# Patient Record
Sex: Male | Born: 1965 | Race: Black or African American | Hispanic: No | Marital: Married | Smoking: Former smoker
Health system: Southern US, Community
[De-identification: ages and names within clinical notes are randomized; demographics above are authoritative.]

## PROBLEM LIST (undated history)

## (undated) DIAGNOSIS — I499 Cardiac arrhythmia, unspecified: Secondary | ICD-10-CM

## (undated) DIAGNOSIS — Z87442 Personal history of urinary calculi: Secondary | ICD-10-CM

## (undated) DIAGNOSIS — I10 Essential (primary) hypertension: Secondary | ICD-10-CM

## (undated) DIAGNOSIS — Z9189 Other specified personal risk factors, not elsewhere classified: Secondary | ICD-10-CM

## (undated) DIAGNOSIS — N200 Calculus of kidney: Secondary | ICD-10-CM

## (undated) DIAGNOSIS — H409 Unspecified glaucoma: Secondary | ICD-10-CM

## (undated) DIAGNOSIS — R3915 Urgency of urination: Secondary | ICD-10-CM

## (undated) DIAGNOSIS — Z973 Presence of spectacles and contact lenses: Secondary | ICD-10-CM

## (undated) HISTORY — PX: EXTRACORPOREAL SHOCK WAVE LITHOTRIPSY: SHX1557

---

## 1998-04-12 ENCOUNTER — Encounter: Payer: Self-pay | Admitting: Urology

## 1998-04-12 ENCOUNTER — Ambulatory Visit (HOSPITAL_COMMUNITY): Admission: RE | Admit: 1998-04-12 | Discharge: 1998-04-12 | Payer: Self-pay | Admitting: Urology

## 1998-04-17 ENCOUNTER — Ambulatory Visit (HOSPITAL_COMMUNITY): Admission: RE | Admit: 1998-04-17 | Discharge: 1998-04-17 | Payer: Self-pay | Admitting: Urology

## 1998-04-17 ENCOUNTER — Encounter: Payer: Self-pay | Admitting: Urology

## 1998-05-10 ENCOUNTER — Encounter: Payer: Self-pay | Admitting: Urology

## 1998-05-10 ENCOUNTER — Ambulatory Visit (HOSPITAL_COMMUNITY): Admission: RE | Admit: 1998-05-10 | Discharge: 1998-05-10 | Payer: Self-pay | Admitting: Urology

## 1998-08-15 ENCOUNTER — Encounter: Payer: Self-pay | Admitting: Urology

## 1998-08-15 ENCOUNTER — Ambulatory Visit (HOSPITAL_COMMUNITY): Admission: RE | Admit: 1998-08-15 | Discharge: 1998-08-15 | Payer: Self-pay | Admitting: Urology

## 2000-05-06 HISTORY — PX: ENDOPYELOTOMY: SHX1501

## 2003-01-31 ENCOUNTER — Ambulatory Visit (HOSPITAL_COMMUNITY): Admission: RE | Admit: 2003-01-31 | Discharge: 2003-01-31 | Payer: Self-pay | Admitting: Urology

## 2003-01-31 ENCOUNTER — Encounter: Payer: Self-pay | Admitting: Urology

## 2004-04-09 ENCOUNTER — Ambulatory Visit (HOSPITAL_COMMUNITY): Admission: RE | Admit: 2004-04-09 | Discharge: 2004-04-09 | Payer: Self-pay | Admitting: Urology

## 2004-10-29 ENCOUNTER — Ambulatory Visit (HOSPITAL_COMMUNITY): Admission: RE | Admit: 2004-10-29 | Discharge: 2004-10-29 | Payer: Self-pay | Admitting: Urology

## 2007-11-13 ENCOUNTER — Ambulatory Visit (HOSPITAL_COMMUNITY): Admission: RE | Admit: 2007-11-13 | Discharge: 2007-11-13 | Payer: Self-pay | Admitting: Urology

## 2009-07-10 ENCOUNTER — Ambulatory Visit (HOSPITAL_COMMUNITY): Admission: RE | Admit: 2009-07-10 | Discharge: 2009-07-10 | Payer: Self-pay | Admitting: Urology

## 2011-12-08 ENCOUNTER — Other Ambulatory Visit: Payer: Self-pay | Admitting: Oncology

## 2014-07-04 ENCOUNTER — Other Ambulatory Visit (HOSPITAL_COMMUNITY): Payer: Self-pay | Admitting: Urology

## 2014-07-04 DIAGNOSIS — Q6211 Congenital occlusion of ureteropelvic junction: Secondary | ICD-10-CM

## 2014-07-04 DIAGNOSIS — N1330 Unspecified hydronephrosis: Secondary | ICD-10-CM

## 2014-07-04 DIAGNOSIS — Q6239 Other obstructive defects of renal pelvis and ureter: Secondary | ICD-10-CM

## 2014-07-18 ENCOUNTER — Ambulatory Visit (HOSPITAL_COMMUNITY): Payer: Managed Care, Other (non HMO) | Attending: Urology

## 2014-09-29 ENCOUNTER — Ambulatory Visit (HOSPITAL_COMMUNITY)
Admission: RE | Admit: 2014-09-29 | Discharge: 2014-09-29 | Disposition: A | Discharge status: Home / Self Care | Payer: 59 | Source: Ambulatory Visit | Attending: Urology | Admitting: Urology

## 2014-09-29 DIAGNOSIS — N1330 Unspecified hydronephrosis: Secondary | ICD-10-CM | POA: Diagnosis not present

## 2014-09-29 DIAGNOSIS — Q6239 Other obstructive defects of renal pelvis and ureter: Secondary | ICD-10-CM

## 2014-09-29 DIAGNOSIS — Q6211 Congenital occlusion of ureteropelvic junction: Secondary | ICD-10-CM

## 2014-09-29 MED ORDER — TECHNETIUM TC 99M MERTIATIDE
15.8000 | Freq: Once | INTRAVENOUS | Status: AC | PRN
  Administered 2014-09-29: 16 via INTRAVENOUS

## 2014-09-29 MED ORDER — FUROSEMIDE 10 MG/ML IJ SOLN
33.0000 mg | Freq: Once | INTRAMUSCULAR | Status: AC
  Administered 2014-09-29: 33 mg via INTRAVENOUS
  Filled 2014-09-29: qty 4

## 2014-10-27 ENCOUNTER — Emergency Department (HOSPITAL_COMMUNITY): Payer: 59

## 2014-10-27 ENCOUNTER — Emergency Department (HOSPITAL_COMMUNITY)
Admission: EM | Admit: 2014-10-27 | Discharge: 2014-10-27 | Disposition: A | Discharge status: Home / Self Care | Payer: 59 | Attending: Emergency Medicine | Admitting: Emergency Medicine

## 2014-10-27 ENCOUNTER — Encounter (HOSPITAL_COMMUNITY): Payer: Self-pay | Admitting: Emergency Medicine

## 2014-10-27 DIAGNOSIS — N201 Calculus of ureter: Secondary | ICD-10-CM | POA: Diagnosis not present

## 2014-10-27 DIAGNOSIS — R1110 Vomiting, unspecified: Secondary | ICD-10-CM | POA: Insufficient documentation

## 2014-10-27 DIAGNOSIS — R509 Fever, unspecified: Secondary | ICD-10-CM | POA: Diagnosis not present

## 2014-10-27 DIAGNOSIS — R1030 Lower abdominal pain, unspecified: Secondary | ICD-10-CM | POA: Diagnosis present

## 2014-10-27 LAB — CBC WITH DIFFERENTIAL/PLATELET
BASOS ABS: 0.1 10*3/uL (ref 0.0–0.1)
BASOS PCT: 1 % (ref 0–1)
EOS PCT: 5 % (ref 0–5)
Eosinophils Absolute: 0.4 10*3/uL (ref 0.0–0.7)
HCT: 41.6 % (ref 39.0–52.0)
Hemoglobin: 14.1 g/dL (ref 13.0–17.0)
LYMPHS PCT: 25 % (ref 12–46)
Lymphs Abs: 2 10*3/uL (ref 0.7–4.0)
MCH: 24.9 pg — ABNORMAL LOW (ref 26.0–34.0)
MCHC: 33.9 g/dL (ref 30.0–36.0)
MCV: 73.4 fL — AB (ref 78.0–100.0)
Monocytes Absolute: 0.7 10*3/uL (ref 0.1–1.0)
Monocytes Relative: 9 % (ref 3–12)
NEUTROS ABS: 4.6 10*3/uL (ref 1.7–7.7)
Neutrophils Relative %: 60 % (ref 43–77)
PLATELETS: 260 10*3/uL (ref 150–400)
RBC: 5.67 MIL/uL (ref 4.22–5.81)
RDW: 15.2 % (ref 11.5–15.5)
WBC: 7.8 10*3/uL (ref 4.0–10.5)

## 2014-10-27 LAB — COMPREHENSIVE METABOLIC PANEL
ALK PHOS: 61 U/L (ref 38–126)
ALT: 37 U/L (ref 17–63)
ANION GAP: 13 (ref 5–15)
AST: 28 U/L (ref 15–41)
Albumin: 4.6 g/dL (ref 3.5–5.0)
BILIRUBIN TOTAL: 0.6 mg/dL (ref 0.3–1.2)
BUN: 20 mg/dL (ref 6–20)
CHLORIDE: 99 mmol/L — AB (ref 101–111)
CO2: 25 mmol/L (ref 22–32)
CREATININE: 1.85 mg/dL — AB (ref 0.61–1.24)
Calcium: 9.3 mg/dL (ref 8.9–10.3)
GFR calc non Af Amer: 41 mL/min — ABNORMAL LOW (ref >60)
GFR, EST AFRICAN AMERICAN: 48 mL/min — AB (ref >60)
GLUCOSE: 95 mg/dL (ref 65–99)
POTASSIUM: 3.2 mmol/L — AB (ref 3.5–5.1)
Sodium: 137 mmol/L (ref 135–145)
TOTAL PROTEIN: 8.7 g/dL — AB (ref 6.5–8.1)

## 2014-10-27 LAB — URINALYSIS, ROUTINE W REFLEX MICROSCOPIC
Bilirubin Urine: NEGATIVE
GLUCOSE, UA: NEGATIVE mg/dL
HGB URINE DIPSTICK: NEGATIVE
Ketones, ur: NEGATIVE mg/dL
Leukocytes, UA: NEGATIVE
Nitrite: NEGATIVE
Protein, ur: NEGATIVE mg/dL
SPECIFIC GRAVITY, URINE: 1.019 (ref 1.005–1.030)
Urobilinogen, UA: 0.2 mg/dL (ref 0.0–1.0)
pH: 5.5 (ref 5.0–8.0)

## 2014-10-27 LAB — LIPASE, BLOOD: Lipase: 33 U/L (ref 22–51)

## 2014-10-27 MED ORDER — SODIUM CHLORIDE 0.9 % IV BOLUS (SEPSIS)
1000.0000 mL | Freq: Once | INTRAVENOUS | Status: AC
  Administered 2014-10-27: 1000 mL via INTRAVENOUS

## 2014-10-27 MED ORDER — IOHEXOL 300 MG/ML  SOLN
100.0000 mL | Freq: Once | INTRAMUSCULAR | Status: AC | PRN
  Administered 2014-10-27: 100 mL via INTRAVENOUS

## 2014-10-27 MED ORDER — HYDROCODONE-ACETAMINOPHEN 5-325 MG PO TABS: 1.0000 | ORAL_TABLET | ORAL | Status: DC | PRN

## 2014-10-27 MED ORDER — KETOROLAC TROMETHAMINE 30 MG/ML IJ SOLN
30.0000 mg | Freq: Once | INTRAMUSCULAR | Status: AC
  Administered 2014-10-27: 30 mg via INTRAVENOUS
  Filled 2014-10-27: qty 1

## 2014-10-27 NOTE — ED Provider Notes (Signed)
CSN: [id number]     Arrival date & time 10/27/14  1639 History   First MD Initiated Contact with Patient 10/27/14 1926     Chief Complaint  Patient presents with  . Abdominal Pain     (Consider location/radiation/quality/duration/timing/severity/associated sxs/prior Treatment) HPI  49 year old male presents with lower abdominal pain that has been progressively worsening over the past 3 days. Pain comes and goes in waves but there is always some level of pain. When he pushes on his lower abdomen it seems to radiate around to his right flank. No hematuria or dysuria but he does feel like he is urinating less. Has felt low-grade temperatures up to 99. Vomited when the pain started but has not had any nausea or vomiting since. No diarrhea or constipation. No prior abdominal issues at all. Rates his pain is moderate. Last took ibuprofen around 2 PM which relieves his pain but only last for about an hour.  History reviewed. No pertinent past medical history. No past surgical history on file. No family history on file. History  Substance Use Topics  . Smoking status: Never Smoker   . Smokeless tobacco: Not on file  . Alcohol Use: No    Review of Systems  Constitutional: Positive for fever.  Gastrointestinal: Positive for vomiting and abdominal pain. Negative for nausea and diarrhea.  Genitourinary: Positive for decreased urine volume. Negative for dysuria.  Musculoskeletal: Positive for back pain.  All other systems reviewed and are negative.     Allergies  Review of patient's allergies indicates no known allergies.  Home Medications   Prior to Admission medications   Not on File   BP 135/89 mmHg  Pulse 69  Temp(Src) 97.9 F (36.6 C) (Oral)  Resp 18  SpO2 99% Physical Exam  Constitutional: He is oriented to person, place, and time. He appears well-developed and well-nourished.  HENT:  Head: Normocephalic and atraumatic.  Right Ear: External ear normal.  Left Ear: External  ear normal.  Nose: Nose normal.  Eyes: Right eye exhibits no discharge. Left eye exhibits no discharge.  Neck: Neck supple.  Cardiovascular: Normal rate, regular rhythm, normal heart sounds and intact distal pulses.   Pulmonary/Chest: Effort normal.  Abdominal: Soft. There is tenderness in the suprapubic area. There is no CVA tenderness.  Musculoskeletal: He exhibits no edema.  Neurological: He is alert and oriented to person, place, and time.  Skin: Skin is warm and dry.  Nursing note and vitals reviewed.   ED Course  Procedures (including critical care time) Labs Review Labs Reviewed  COMPREHENSIVE METABOLIC PANEL - Abnormal; Notable for the following:    Potassium 3.2 (*)    Chloride 99 (*)    Creatinine, Ser 1.85 (*)    Total Protein 8.7 (*)    GFR calc non Af Amer 41 (*)    GFR calc Af Amer 48 (*)    All other components within normal limits  CBC WITH DIFFERENTIAL/PLATELET - Abnormal; Notable for the following:    MCV 73.4 (*)    MCH 24.9 (*)    All other components within normal limits  URINALYSIS, ROUTINE W REFLEX MICROSCOPIC (NOT AT [facility]) - Abnormal; Notable for the following:    APPearance CLOUDY (*)    All other components within normal limits  LIPASE, BLOOD    Imaging Review Ct Abdomen Pelvis W Contrast  10/27/2014   CLINICAL DATA:  Acute onset right lower quadrant abdominal pain for 2 days. Low-grade fever. Initial encounter.  EXAM: CT ABDOMEN AND  PELVIS WITH CONTRAST  TECHNIQUE: Multidetector CT imaging of the abdomen and pelvis was performed using the standard protocol following bolus administration of intravenous contrast.  CONTRAST:  OMNIPAQUE IOHEXOL 300 MG/ML  SOLN  COMPARISON:  Abdominal radiograph performed 06/28/2014  FINDINGS: Mild patchy left basilar opacity may reflect atelectasis or mild pneumonia.  The liver and spleen are unremarkable in appearance. The gallbladder is within normal limits. The pancreas and adrenal glands are unremarkable.  There  is mild right-sided hydronephrosis. Delayed images demonstrate no evidence of contrast excretion on the right, concerning for renal insufficiency secondary to obstruction. An obstructing 5 x 4 mm stone is noted proximally at the right ureteropelvic junction. Two nonobstructing stones are noted at the lower pole of the right kidney, measuring up to 5 mm in size.  There is severe chronic left-sided hydronephrosis, with mild underlying renal atrophy. There is diffuse distention of the left ureter, without evidence of a distal obstructing stone. This may reflect a distal stricture at the left vesicoureteral junction.  No free fluid is identified. The small bowel is unremarkable in appearance. The stomach is within normal limits. No acute vascular abnormalities are seen.  The appendix is normal in caliber and contains trace air, without evidence of appendicitis. The colon is unremarkable in appearance.  The bladder is mildly distended and grossly unremarkable in appearance. A small urachal remnant is incidentally seen. The prostate remains normal in size, with central calcification. No inguinal lymphadenopathy is seen.  No acute osseous abnormalities are identified.  IMPRESSION: 1. Mild right-sided hydronephrosis. Obstructing 5 x 4 mm stone noted proximally at the right ureteropelvic junction. Delayed images demonstrate no evidence of contrast excretion on the right, concerning for renal insufficiency secondary to obstruction. 2. Severe chronic left-sided hydronephrosis, with mild underlying left renal atrophy. Diffuse distention of the left ureter, without evidence of a distal obstructing stone on the left. This may reflect a distal stricture at the left vesicoureteral junction. Given effective bilateral obstruction, there is significant concern for impending renal failure. Urology consult is recommended. 3. Two nonobstructing right renal stones noted, measuring up to 5 mm in size. 4. Mild patchy left basilar airspace  opacity may reflect atelectasis or mild pneumonia.  These results were called by telephone at the time of interpretation on 10/27/2014 at 9:35 pm to Dr. Pricilla Loveless , who verbally acknowledged these results.   Electronically Signed   By: Roanna Raider M.D.   On: 10/27/2014 21:37     EKG Interpretation None      MDM   Final diagnoses:  Obstruction of right ureteropelvic junction (UPJ) due to stone    Patient's symptoms appear to be from right UPJ stone. Given his chronic left kidney problems he has concerning hydronephrosis. D/w Dr. Ronne Binning, who reviewed urology notes and notes he has a typical Cr of 1.6. Was given IV fluids in ED, and cautioned on no more ibuprofen use for now. Pain is controlled. Dr. Ronne Binning recommends discharge with follow up in his office in AM for exam and possible removal in afternoon.     Pricilla Loveless, MD 10/28/14 [phone]

## 2014-10-27 NOTE — ED Notes (Signed)
Pt c/o RLQ since Monday.  Pt reports one episode of vomiting since then.  Sent by PCP to r/o appy.

## 2014-10-27 NOTE — Discharge Instructions (Signed)
Do not take ibuprofen, Aleve, Motrin, or other NSAIDs due to your kidney function. Take tylenol and/or the Hydrocodone. Follow-up in Dr. Dimas Millin office tomorrow morning, you may need to have the kidney stone removed tomorrow.

## 2014-10-31 ENCOUNTER — Other Ambulatory Visit: Payer: Self-pay | Admitting: Urology

## 2014-10-31 ENCOUNTER — Other Ambulatory Visit (HOSPITAL_COMMUNITY): Payer: Self-pay | Admitting: Anesthesiology

## 2014-10-31 NOTE — Progress Notes (Signed)
10/27/2014-noted in EPIC- labs-CBC w/diff., CMP, Lipase U/A and Xray CT Abd./pelvis w/contrast

## 2014-10-31 NOTE — Patient Instructions (Addendum)
[name]  10/31/2014   Your procedure is scheduled on: Wednesday 11/02/2014  Report to [facility]  Entrance take [facility]  elevators to 3rd floor to  Short Stay Center at  0630 AM.  Call this number if you have problems the morning of surgery [phone]   Remember: ONLY 1 PERSON MAY GO WITH YOU TO SHORT STAY TO GET  READY MORNING OF YOUR SURGERY.  Do not eat food or drink liquids :After Midnight.     Take these medicines the morning of surgery with A SIP OF WATER: Amlodipine                               You may not have any metal on your body including hair pins and              piercings  Do not wear jewelry, make-up, lotions, powders or perfumes, deodorant             Do not wear nail polish.  Do not shave  48 hours prior to surgery.              Men may shave face and neck.   Do not bring valuables to the hospital. [facility] IS NOT             RESPONSIBLE   FOR VALUABLES.  Contacts, dentures or bridgework may not be worn into surgery.  Leave suitcase in the car. After surgery it may be brought to your room.     Patients discharged the day of surgery will not be allowed to drive home.  Name and phone number of your driver:Wife Lennart Fergen cell [phone]          _____________________________________________________________________             [facility] - Preparing for Surgery Before surgery, you can play an important role.  Because skin is not sterile, your skin needs to be as free of germs as possible.  You can reduce the number of germs on your skin by washing with CHG (chlorahexidine gluconate) soap before surgery.  CHG is an antiseptic cleaner which kills germs and bonds with the skin to continue killing germs even after washing. Please DO NOT use if you have an allergy to CHG or antibacterial soaps.  If your skin becomes reddened/irritated stop using the CHG and inform your nurse when you arrive at Short Stay. Do not shave  (including legs and underarms) for at least 48 hours prior to the first CHG shower.  You may shave your face/neck. Please follow these instructions carefully:  1.  Shower with CHG Soap the night before surgery and the  morning of Surgery.  2.  If you choose to wash your hair, wash your hair first as usual with your  normal  shampoo.  3.  After you shampoo, rinse your hair and body thoroughly to remove the  shampoo.                           4.  Use CHG as you would any other liquid soap.  You can apply chg directly  to the skin and wash                       Gently with a scrungie  or clean washcloth.  5.  Apply the CHG Soap to your body ONLY FROM THE NECK DOWN.   Do not use on face/ open                           Wound or open sores. Avoid contact with eyes, ears mouth and genitals (private parts).                       Wash face,  Genitals (private parts) with your normal soap.             6.  Wash thoroughly, paying special attention to the area where your surgery  will be performed.  7.  Thoroughly rinse your body with warm water from the neck down.  8.  DO NOT shower/wash with your normal soap after using and rinsing off  the CHG Soap.                9.  Pat yourself dry with a clean towel.            10.  Wear clean pajamas.            11.  Place clean sheets on your bed the night of your first shower and do not  sleep with pets. Day of Surgery : Do not apply any lotions/deodorants the morning of surgery.  Please wear clean clothes to the hospital/surgery center.  FAILURE TO FOLLOW THESE INSTRUCTIONS MAY RESULT IN THE CANCELLATION OF YOUR SURGERY PATIENT SIGNATURE_________________________________  NURSE SIGNATURE__________________________________  ________________________________________________________________________

## 2014-11-01 ENCOUNTER — Encounter (HOSPITAL_COMMUNITY)
Admission: RE | Admit: 2014-11-01 | Discharge: 2014-11-01 | Disposition: A | Discharge status: Home / Self Care | Payer: 59 | Source: Ambulatory Visit | Attending: Urology | Admitting: Urology

## 2014-11-01 ENCOUNTER — Encounter (HOSPITAL_COMMUNITY): Payer: Self-pay

## 2014-11-01 DIAGNOSIS — N132 Hydronephrosis with renal and ureteral calculous obstruction: Secondary | ICD-10-CM | POA: Diagnosis not present

## 2014-11-01 DIAGNOSIS — N289 Disorder of kidney and ureter, unspecified: Secondary | ICD-10-CM | POA: Diagnosis not present

## 2014-11-01 DIAGNOSIS — N135 Crossing vessel and stricture of ureter without hydronephrosis: Secondary | ICD-10-CM | POA: Diagnosis present

## 2014-11-01 DIAGNOSIS — Z79899 Other long term (current) drug therapy: Secondary | ICD-10-CM | POA: Diagnosis not present

## 2014-11-01 DIAGNOSIS — I4589 Other specified conduction disorders: Secondary | ICD-10-CM | POA: Diagnosis not present

## 2014-11-01 DIAGNOSIS — I10 Essential (primary) hypertension: Secondary | ICD-10-CM | POA: Diagnosis not present

## 2014-11-01 HISTORY — DX: Personal history of urinary calculi: Z87.442

## 2014-11-01 HISTORY — DX: Essential (primary) hypertension: I10

## 2014-11-02 ENCOUNTER — Encounter (HOSPITAL_COMMUNITY): Payer: Self-pay | Admitting: *Deleted

## 2014-11-02 ENCOUNTER — Ambulatory Visit (HOSPITAL_COMMUNITY)
Admission: RE | Admit: 2014-11-02 | Discharge: 2014-11-02 | Disposition: A | Discharge status: Home / Self Care | Payer: 59 | Source: Ambulatory Visit | Attending: Urology | Admitting: Urology

## 2014-11-02 ENCOUNTER — Ambulatory Visit (HOSPITAL_COMMUNITY): Payer: 59 | Admitting: Certified Registered Nurse Anesthetist

## 2014-11-02 ENCOUNTER — Encounter (HOSPITAL_COMMUNITY)
Admission: RE | Disposition: A | Discharge status: Home / Self Care | Payer: Self-pay | Source: Ambulatory Visit | Attending: Urology

## 2014-11-02 DIAGNOSIS — N289 Disorder of kidney and ureter, unspecified: Secondary | ICD-10-CM | POA: Insufficient documentation

## 2014-11-02 DIAGNOSIS — N135 Crossing vessel and stricture of ureter without hydronephrosis: Secondary | ICD-10-CM | POA: Insufficient documentation

## 2014-11-02 DIAGNOSIS — I4589 Other specified conduction disorders: Secondary | ICD-10-CM | POA: Insufficient documentation

## 2014-11-02 DIAGNOSIS — Z79899 Other long term (current) drug therapy: Secondary | ICD-10-CM | POA: Insufficient documentation

## 2014-11-02 DIAGNOSIS — N132 Hydronephrosis with renal and ureteral calculous obstruction: Secondary | ICD-10-CM | POA: Insufficient documentation

## 2014-11-02 DIAGNOSIS — I10 Essential (primary) hypertension: Secondary | ICD-10-CM | POA: Insufficient documentation

## 2014-11-02 HISTORY — PX: CYSTOSCOPY WITH RETROGRADE PYELOGRAM, URETEROSCOPY AND STENT PLACEMENT: SHX5789

## 2014-11-02 SURGERY — CYSTOURETEROSCOPY, WITH RETROGRADE PYELOGRAM AND STENT INSERTION
Anesthesia: General | Site: Ureter | Laterality: Right

## 2014-11-02 MED ORDER — LIDOCAINE HCL (CARDIAC) 20 MG/ML IV SOLN
INTRAVENOUS | Status: AC
Start: 2014-11-02 — End: 2014-11-02
  Filled 2014-11-02: qty 5

## 2014-11-02 MED ORDER — FENTANYL CITRATE (PF) 100 MCG/2ML IJ SOLN
INTRAMUSCULAR | Status: AC
  Filled 2014-11-02: qty 2

## 2014-11-02 MED ORDER — PROPOFOL 10 MG/ML IV BOLUS
INTRAVENOUS | Status: AC
  Filled 2014-11-02: qty 20

## 2014-11-02 MED ORDER — SODIUM CHLORIDE 0.9 % IR SOLN
Status: DC | PRN
  Administered 2014-11-02: 3000 mL
  Administered 2014-11-02: 1000 mL

## 2014-11-02 MED ORDER — PROPOFOL 10 MG/ML IV BOLUS
INTRAVENOUS | Status: DC | PRN
  Administered 2014-11-02: 50 mg via INTRAVENOUS
  Administered 2014-11-02: 150 mg via INTRAVENOUS

## 2014-11-02 MED ORDER — ONDANSETRON HCL 4 MG/2ML IJ SOLN
INTRAMUSCULAR | Status: DC | PRN
  Administered 2014-11-02: 4 mg via INTRAVENOUS

## 2014-11-02 MED ORDER — MIDAZOLAM HCL 5 MG/5ML IJ SOLN
INTRAMUSCULAR | Status: DC | PRN
  Administered 2014-11-02: 2 mg via INTRAVENOUS

## 2014-11-02 MED ORDER — IOHEXOL 300 MG/ML  SOLN
INTRAMUSCULAR | Status: DC | PRN
  Administered 2014-11-02: 7 mL via URETHRAL

## 2014-11-02 MED ORDER — MEPERIDINE HCL 50 MG/ML IJ SOLN: 6.2500 mg | INTRAMUSCULAR | Status: DC | PRN

## 2014-11-02 MED ORDER — ONDANSETRON HCL 4 MG/2ML IJ SOLN: 4.0000 mg | Freq: Once | INTRAMUSCULAR | Status: DC | PRN

## 2014-11-02 MED ORDER — ONDANSETRON HCL 4 MG/2ML IJ SOLN
INTRAMUSCULAR | Status: AC
  Filled 2014-11-02: qty 2

## 2014-11-02 MED ORDER — TAMSULOSIN HCL 0.4 MG PO CAPS: 0.4000 mg | ORAL_CAPSULE | Freq: Every day | ORAL | Status: DC

## 2014-11-02 MED ORDER — HYDROMORPHONE HCL 1 MG/ML IJ SOLN: 0.2500 mg | INTRAMUSCULAR | Status: DC | PRN

## 2014-11-02 MED ORDER — FENTANYL CITRATE (PF) 100 MCG/2ML IJ SOLN
INTRAMUSCULAR | Status: DC | PRN
  Administered 2014-11-02 (×2): 50 ug via INTRAVENOUS
  Administered 2014-11-02 (×4): 25 ug via INTRAVENOUS

## 2014-11-02 MED ORDER — BELLADONNA ALKALOIDS-OPIUM 16.2-60 MG RE SUPP
RECTAL | Status: AC
  Filled 2014-11-02: qty 1

## 2014-11-02 MED ORDER — LIDOCAINE HCL (CARDIAC) 20 MG/ML IV SOLN
INTRAVENOUS | Status: DC | PRN
  Administered 2014-11-02: 100 mg via INTRAVENOUS

## 2014-11-02 MED ORDER — CEFTRIAXONE SODIUM IN DEXTROSE 20 MG/ML IV SOLN
1.0000 g | Freq: Once | INTRAVENOUS | Status: AC
  Administered 2014-11-02: 1 g via INTRAVENOUS
  Filled 2014-11-02: qty 50

## 2014-11-02 MED ORDER — LIDOCAINE HCL 2 % EX GEL
CUTANEOUS | Status: AC
  Filled 2014-11-02: qty 10

## 2014-11-02 MED ORDER — OXYBUTYNIN CHLORIDE 5 MG PO TABS: 5.0000 mg | ORAL_TABLET | Freq: Three times a day (TID) | ORAL | Status: DC | PRN

## 2014-11-02 MED ORDER — HYDROCODONE-ACETAMINOPHEN 5-325 MG PO TABS
1.0000 | ORAL_TABLET | ORAL | Status: DC | PRN
  Administered 2014-11-02: 1 via ORAL
  Filled 2014-11-02: qty 1

## 2014-11-02 MED ORDER — LACTATED RINGERS IV SOLN
INTRAVENOUS | Status: DC | PRN
  Administered 2014-11-02: 08:00:00 via INTRAVENOUS

## 2014-11-02 MED ORDER — HYDROCODONE-ACETAMINOPHEN 5-325 MG PO TABS: 1.0000 | ORAL_TABLET | ORAL | Status: DC | PRN

## 2014-11-02 MED ORDER — MIDAZOLAM HCL 2 MG/2ML IJ SOLN
INTRAMUSCULAR | Status: AC
  Filled 2014-11-02: qty 2

## 2014-11-02 SURGICAL SUPPLY — 12 items
CATH INTERMIT  6FR 70CM (CATHETERS) ×3 IMPLANT
CLOTH BEACON ORANGE TIMEOUT ST (SAFETY) ×3 IMPLANT
GLOVE BIOGEL M STRL SZ7.5 (GLOVE) ×3 IMPLANT
GOWN STRL REUS W/TWL XL LVL3 (GOWN DISPOSABLE) ×6 IMPLANT
GUIDEWIRE ANG ZIPWIRE 038X150 (WIRE) ×3 IMPLANT
GUIDEWIRE STR DUAL SENSOR (WIRE) ×3 IMPLANT
IV NS IRRIG 3000ML ARTHROMATIC (IV SOLUTION) ×3 IMPLANT
PACK CYSTO (CUSTOM PROCEDURE TRAY) ×3 IMPLANT
SHEATH ACCESS URETERAL 38CM (SHEATH) ×3 IMPLANT
STENT URET 6FRX24 CONTOUR (STENTS) ×3 IMPLANT
SYRINGE 10CC LL (SYRINGE) ×3 IMPLANT
TUBE FEEDING 8FR 16IN STR KANG (MISCELLANEOUS) ×3 IMPLANT

## 2014-11-02 NOTE — Anesthesia Preprocedure Evaluation (Signed)
Anesthesia Evaluation  Patient identified by MRN, date of birth, ID band Patient awake    Reviewed: Allergy & Precautions, NPO status , Patient's Chart, lab work & pertinent test results  Airway Mallampati: I  TM Distance: >3 FB Neck ROM: Full    Dental   Pulmonary    Pulmonary exam normal        Cardiovascular hypertension, Pt. on medications Normal cardiovascular exam     Neuro/Psych    GI/Hepatic   Endo/Other    Renal/GU      Musculoskeletal   Abdominal   Peds  Hematology   Anesthesia Other Findings   Reproductive/Obstetrics                             Anesthesia Physical Anesthesia Plan  ASA: II  Anesthesia Plan: General   Post-op Pain Management:    Induction: Intravenous  Airway Management Planned: LMA  Additional Equipment:   Intra-op Plan:   Post-operative Plan: Extubation in OR  Informed Consent: I have reviewed the patients History and Physical, chart, labs and discussed the procedure including the risks, benefits and alternatives for the proposed anesthesia with the patient or authorized representative who has indicated his/her understanding and acceptance.     Plan Discussed with: CRNA and Surgeon  Anesthesia Plan Comments:         Anesthesia Quick Evaluation  

## 2014-11-02 NOTE — Anesthesia Postprocedure Evaluation (Signed)
Anesthesia Post Note  Patient: [name]  Procedure(s) Performed: Procedure(s) (LRB): CYSTOSCOPY WITH RIGHT  RETROGRADE PYELOGRAM, diagnostic URETEROSCOPY AND STENT PLACEMENT (Right)  Anesthesia type: general  Patient location: PACU  Post pain: Pain level controlled  Post assessment: Patient's Cardiovascular Status Stable  Last Vitals:  Filed Vitals:   11/02/14 0957  BP: 134/95  Pulse: 69  Temp: 36.5 C  Resp: 16    Post vital signs: Reviewed and stable  Level of consciousness: sedated  Complications: No apparent anesthesia complications

## 2014-11-02 NOTE — H&P (Signed)
Terrance BackKenneth B Morales is an 49 y.o. male.    Chief Complaint: Pre-OP Right Ureteroscopic Stone Manipulation  HPI:    1 - Recurrent Nephrolithiasis -  2011 - SWL x2 10/2014 - Rt 4mm prox ureteral with hydro (SSD 11cm, 500HU) and 5mm ipsilateral lower pole by CT in ER 10/27/14. Not seen on scout images due to bowel gas.   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  2016 - renogram with symmeteric function and only slihgt left delay, T1/2 17 min  3 - Renal Insufficiency - cr 1.8 noted on ER labs during eval for right ureteral stone. Non-diabetic.   PMH unremarkable. NO CV disease. 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 No interval fevers or stone passage. Most recent UCX negative.    Past Medical History  Diagnosis Date  . Hypertension   . History of kidney stones     Past Surgical History  Procedure Laterality Date  . Lithotripsy  last done 5 years ago    x 2  . Procedure on left kidney  2002    No family history on file. Social History:  reports that he has never smoked. He has never used smokeless tobacco. He reports that he drinks alcohol. He reports that he does not use illicit drugs.  Allergies: No Known Allergies  No prescriptions prior to admission    No results found for this or any previous visit (from the past 48 hour(s)). No results found.  Review of Systems  Constitutional: Negative.  Negative for fever and chills.  HENT: Negative.   Eyes: Negative.   Respiratory: Negative.   Cardiovascular: Negative.   Gastrointestinal: Negative.  Negative for nausea and vomiting.  Genitourinary: Positive for flank pain.  Musculoskeletal: Negative.   Skin: Negative.   Neurological: Negative.   Endo/Heme/Allergies: Negative.   Psychiatric/Behavioral: Negative.     There were no vitals taken for this visit. Physical Exam  Constitutional: He appears well-developed.  HENT:  Head: Normocephalic.   Eyes: Pupils are equal, round, and reactive to light.  Neck: Normal range of motion.  Cardiovascular: Normal rate.   Respiratory: Effort normal.  GI: Soft.  Genitourinary:  Mild Rt CVAT  Musculoskeletal: Normal range of motion.  Neurological: He is alert.  Skin: Skin is warm.  Psychiatric: He has a normal mood and affect. His behavior is normal. Judgment and thought content normal.     Assessment/Plan   1 - Recurrent Nephrolithiasis -   We rediscussed ureteroscopic stone manipulation with basketing and laser-lithotripsy in detail.  We rediscussed risks including bleeding, infection, damage to kidney / ureter  bladder, rarely loss of kidney. We rediscussed anesthetic risks and rare but serious surgical complications including DVT, PE, MI, and mortality. We specifically readdressed that in 5-10% of cases a staged approach is required with stenting followed by re-attempt ureteroscopy if anatomy unfavorable.   The patient voiced understanding and wishes to proceed today as planned.   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  Avan Gullett 11/02/2014, 6:33 AM

## 2014-11-02 NOTE — Discharge Instructions (Signed)
1 - You may have urinary urgency (bladder spasms) and bloody urine on / off with stent in place. This is normal. ° °2 - Call MD or go to ER for fever >102, severe pain / nausea / vomiting not relieved by medications, or acute change in medical status ° °

## 2014-11-02 NOTE — Progress Notes (Signed)
Note pt has LR infused in short stay (post op)

## 2014-11-02 NOTE — Op Note (Signed)
NAMEMarland Kitchen  BESNIK, FEBUS NO.:  [id number]  MEDICAL RECORD NO.:  [id number]  LOCATION:  [facility]                         FACILITY:  [facility]  PHYSICIAN:  Sebastian Ache, MD     DATE OF BIRTH:  Oct 09, 1965  DATE OF PROCEDURE:  11/02/2014                              OPERATIVE REPORT  DIAGNOSES: 1. Chronic mild left ureterovesical junction obstruction. 2. Right ureteral and renal stones with renal colic.  PROCEDURES: 1. Cystoscopy with right retrograde pyelogram with interpretation. 2. Right diagnostic ureteroscopy. 3. Insertion of right ureteral stent 6 x 24 contour, no tether.  SURGEON:  Sebastian Ache, MD.  ESTIMATED BLOOD LOSS:  Nil.  COMPLICATIONS:  None.  SPECIMEN:  None.  FINDINGS: 1. Very petite body habitus. 2. Significant relative narrowing and tortuosity of proximal right ureter     without focal stricture.  This did not allow safe navigation even     with a single channel ureteroscope to the level of the kidney. 3. Retrograde positioning of proximal ureteral stones to the area of     lower pole. 4. Successful placement of right ureteral stent, proximal in upper     pole, distal in urinary bladder.  INDICATION:  Mr. Kantner is a pleasant 49 year old gentleman with history of nephrolithiasis and chronic left-sided obstruction, status post endoscopic management of his left side, and he has had stable renal function with only mild delaying on the left.  He unfortunately developed acute right renal colic from multifocal right-sided stent, including a proximal ureteral stone and presented to the office late last week.  Options were discussed for management including shockwave lithotripsy versus medical therapy versus ureteroscopy with goal of stone freeing, and he wished to proceed with the latter.  Informed consent was obtained and placed in medical record.  PROCEDURE IN DETAIL:  The patient being Tullio Chausse and procedure being right  ureteroscopic stone manipulation was confirmed.  Procedure was carried out.  Time-out was performed.  Intravenous antibiotics were administered.  General LMA anesthesia was induced.  The patient was placed into a low lithotomy position.  Sterile field was created by prepping the patient's penis, perineum, proximal thighs using iodine x3. Next, cystourethroscopy was performed using a 23-French rigid cystoscope with 30-degree lens.  First, his anterior and posterior urethra were unremarkable.  Inspection of the urinary bladder revealed no diverticula, calcifications, papular lesions.  Both ureteral orifices were singleton and visibly patent.  The right ureteral orifice was cannulated with a 6-French end-hole catheter, and right retrograde pyelogram was obtained.  Right retrograde pyelogram demonstrated a single right ureter, single system right kidney.  There was some relative tortuosity of the proximal ureter without focal stricture.  Calcification was noted at the area of the UPJ with contrast administration.  This was retrograde positioned into the lower pole where another filling defect was also visualized consistent with known stone in this location.  A 0.038 zip wire was advanced at the level of the upper pole and set aside as a safety wire. An 8-French feeding tube was placed in the urinary bladder for pressure release.  Next, a semi-rigid ureteroscopy was performed in the distal orifice of the right ureter alongside a separate Sensor working wire. Ureter  was somewhat narrow in caliber, but no focal strictures were noted.  Ureter did appear amenable to access sheath placement as such the semi-rigid ureteroscope was exchanged for a 12/14 ureteral access sheath which was placed under continuous fluoroscopic vision to the level of the proximal third of the ureter over the Sensor working wire. Next, flexible digital ureteroscopy was performed of the proximal ureter using a single  channel flexible ureteroscope, given the expected narrow caliber and tortuosity of the ureter.  This was able to navigate towards the area of the UPJ which unfortunately as stated was quite tortuous and somewhat narrow, such that it would not easily accommodate even the single channel ureteroscope.  No focal strictures were visualized.  This was felt to be physiologic in nature.  The safest way to proceed would be place an interval ureteral stent, allow for a passive dilation of the area and then re-attempt right ureteroscopy in 2-3 weeks; as such, the access sheath was removed under continuous ureteroscopic vision.  No mucosal abnormalities were found.  A new 6 x 24 contour type stent was then placed using cystoscopic guidance, proximal in the upper pole, distal in the urinary bladder.  There was efflux of urine seen around into the distal end of the stent.  Bladder was emptied per cystoscope. The procedure was terminated.  The patient tolerated the procedure well. There were no immediate periprocedural complications.  The patient was taken to the postanesthesia care unit in stable condition.          ______________________________ Sebastian Acheheodore Eland Lamantia, MD     TM/MEDQ  D:  11/02/2014  T:  11/02/2014  Job:  [id number]

## 2014-11-02 NOTE — Transfer of Care (Signed)
Immediate Anesthesia Transfer of Care Note  Patient: [name]  Procedure(s) Performed: Procedure(s): CYSTOSCOPY WITH RIGHT  RETROGRADE PYELOGRAM, diagnostic URETEROSCOPY AND STENT PLACEMENT (Right)  Patient Location: PACU  Anesthesia Type:General  Level of Consciousness:  sedated, patient cooperative and responds to stimulation  Airway & Oxygen Therapy:Patient Spontanous Breathing and Patient connected to face mask oxgen  Post-op Assessment:  Report given to PACU RN and Post -op Vital signs reviewed and stable  Post vital signs:  Reviewed and stable  Last Vitals:  Filed Vitals:   11/02/14 0653  BP: 139/94  Pulse: 61  Temp: 36.8 C  Resp: 18    Complications: No apparent anesthesia complications

## 2014-11-02 NOTE — Brief Op Note (Signed)
11/02/2014  8:58 AM  PATIENT:  Terrance BackKenneth B Morales  49 y.o. male  PRE-OPERATIVE DIAGNOSIS:  RIGHT URETERAL AND RENAL STONES  POST-OPERATIVE DIAGNOSIS:  RIGHT URETERAL AND RENAL STONES  PROCEDURE:  Procedure(s): CYSTOSCOPY WITH RIGHT  RETROGRADE PYELOGRAM, diagnostic URETEROSCOPY AND STENT PLACEMENT (Right)  SURGEON:  Surgeon(s) and Role:    * Sebastian Acheheodore Soyla Bainter, MD - Primary  PHYSICIAN ASSISTANT:   ASSISTANTS: none   ANESTHESIA:   general  EBL:     BLOOD ADMINISTERED:none  DRAINS: none   LOCAL MEDICATIONS USED:  NONE  SPECIMEN:  No Specimen  DISPOSITION OF SPECIMEN:  N/A  COUNTS:  YES  TOURNIQUET:  * No tourniquets in log *  DICTATION: .Other Dictation: Dictation Number [id number]  PLAN OF CARE: Discharge to home after PACU  PATIENT DISPOSITION:  PACU - hemodynamically stable.   Delay start of Pharmacological VTE agent (>24hrs) due to surgical blood loss or risk of bleeding: yes

## 2014-11-02 NOTE — Anesthesia Procedure Notes (Signed)
Procedure Name: LMA Insertion Date/Time: 11/02/2014 8:29 AM Performed by: Orest DikesPETERS, Dema Timmons J Pre-anesthesia Checklist: Patient identified, Emergency Drugs available, Suction available and Patient being monitored Patient Re-evaluated:Patient Re-evaluated prior to inductionOxygen Delivery Method: Circle system utilized Preoxygenation: Pre-oxygenation with 100% oxygen Intubation Type: IV induction LMA: LMA inserted LMA Size: 4.0 Number of attempts: 1 Tube secured with: Tape Dental Injury: Teeth and Oropharynx as per pre-operative assessment

## 2014-11-03 ENCOUNTER — Encounter (HOSPITAL_COMMUNITY): Payer: Self-pay | Admitting: Urology

## 2014-11-03 ENCOUNTER — Other Ambulatory Visit: Payer: Self-pay | Admitting: Urology

## 2014-11-23 ENCOUNTER — Encounter (HOSPITAL_BASED_OUTPATIENT_CLINIC_OR_DEPARTMENT_OTHER): Payer: Self-pay | Admitting: *Deleted

## 2014-11-23 NOTE — Progress Notes (Signed)
   11/23/14 1111  OBSTRUCTIVE SLEEP APNEA  Have you ever been diagnosed with sleep apnea through a sleep study? No  Do you snore loudly (loud enough to be heard through closed doors)?  1  Do you often feel tired, fatigued, or sleepy during the daytime? 0  Has anyone observed you stop breathing during your sleep? 0  Do you have, or are you being treated for high blood pressure? 1  BMI more than 35 kg/m2? 0  Age over 49 years old? 0  Neck circumference greater than 40 cm/16 inches? 1  Gender: 1  Obstructive Sleep Apnea Score 4

## 2014-11-23 NOTE — Progress Notes (Addendum)
NPO AFTER MN.  ARRIVE AT 0945.  NEEDS ISTAT (RE-CHECK K+, 11-02-2014 K+ WAS 3.2)  CURRENT EKG IN CHART AND EPIC.  WILL TAKE NORVASC AM DOS W/ SIPS OF WATER AND IF NEEDED TAKE PRN RX'S.

## 2014-11-25 ENCOUNTER — Encounter (HOSPITAL_BASED_OUTPATIENT_CLINIC_OR_DEPARTMENT_OTHER)
Admission: RE | Disposition: A | Discharge status: Home / Self Care | Payer: Self-pay | Source: Ambulatory Visit | Attending: Urology

## 2014-11-25 ENCOUNTER — Encounter (HOSPITAL_BASED_OUTPATIENT_CLINIC_OR_DEPARTMENT_OTHER): Payer: Self-pay | Admitting: Anesthesiology

## 2014-11-25 ENCOUNTER — Ambulatory Visit (HOSPITAL_BASED_OUTPATIENT_CLINIC_OR_DEPARTMENT_OTHER)
Admission: RE | Admit: 2014-11-25 | Discharge: 2014-11-25 | Disposition: A | Discharge status: Home / Self Care | Payer: 59 | Source: Ambulatory Visit | Attending: Urology | Admitting: Urology

## 2014-11-25 ENCOUNTER — Ambulatory Visit (HOSPITAL_BASED_OUTPATIENT_CLINIC_OR_DEPARTMENT_OTHER): Payer: 59 | Admitting: Anesthesiology

## 2014-11-25 DIAGNOSIS — N202 Calculus of kidney with calculus of ureter: Secondary | ICD-10-CM | POA: Diagnosis present

## 2014-11-25 DIAGNOSIS — Z9889 Other specified postprocedural states: Secondary | ICD-10-CM | POA: Diagnosis not present

## 2014-11-25 DIAGNOSIS — Z87891 Personal history of nicotine dependence: Secondary | ICD-10-CM | POA: Insufficient documentation

## 2014-11-25 DIAGNOSIS — N289 Disorder of kidney and ureter, unspecified: Secondary | ICD-10-CM | POA: Diagnosis not present

## 2014-11-25 DIAGNOSIS — Z79899 Other long term (current) drug therapy: Secondary | ICD-10-CM | POA: Insufficient documentation

## 2014-11-25 DIAGNOSIS — I10 Essential (primary) hypertension: Secondary | ICD-10-CM | POA: Diagnosis not present

## 2014-11-25 DIAGNOSIS — N135 Crossing vessel and stricture of ureter without hydronephrosis: Secondary | ICD-10-CM | POA: Diagnosis not present

## 2014-11-25 DIAGNOSIS — H409 Unspecified glaucoma: Secondary | ICD-10-CM | POA: Diagnosis not present

## 2014-11-25 HISTORY — PX: HOLMIUM LASER APPLICATION: SHX5852

## 2014-11-25 HISTORY — PX: CYSTOSCOPY WITH URETEROSCOPY AND STENT PLACEMENT: SHX6377

## 2014-11-25 HISTORY — PX: CYSTOSCOPY W/ RETROGRADES: SHX1426

## 2014-11-25 HISTORY — DX: Calculus of kidney: N20.0

## 2014-11-25 HISTORY — DX: Other specified personal risk factors, not elsewhere classified: Z91.89

## 2014-11-25 HISTORY — DX: Presence of spectacles and contact lenses: Z97.3

## 2014-11-25 HISTORY — DX: Unspecified glaucoma: H40.9

## 2014-11-25 HISTORY — DX: Urgency of urination: R39.15

## 2014-11-25 HISTORY — PX: STONE EXTRACTION WITH BASKET: SHX5318

## 2014-11-25 LAB — POCT I-STAT 4, (NA,K, GLUC, HGB,HCT)
GLUCOSE: 105 mg/dL — AB (ref 65–99)
HEMATOCRIT: 42 % (ref 39.0–52.0)
Hemoglobin: 14.3 g/dL (ref 13.0–17.0)
Potassium: 3.5 mmol/L (ref 3.5–5.1)
SODIUM: 141 mmol/L (ref 135–145)

## 2014-11-25 SURGERY — CYSTOURETEROSCOPY, WITH STENT INSERTION
Anesthesia: General | Site: Ureter | Laterality: Right

## 2014-11-25 MED ORDER — FENTANYL CITRATE (PF) 100 MCG/2ML IJ SOLN
INTRAMUSCULAR | Status: DC | PRN
  Administered 2014-11-25: 100 ug via INTRAVENOUS

## 2014-11-25 MED ORDER — DEXAMETHASONE SODIUM PHOSPHATE 4 MG/ML IJ SOLN
INTRAMUSCULAR | Status: DC | PRN
  Administered 2014-11-25: 10 mg via INTRAVENOUS

## 2014-11-25 MED ORDER — FENTANYL CITRATE (PF) 100 MCG/2ML IJ SOLN
INTRAMUSCULAR | Status: AC
  Filled 2014-11-25: qty 2

## 2014-11-25 MED ORDER — ONDANSETRON HCL 4 MG/2ML IJ SOLN
INTRAMUSCULAR | Status: DC | PRN
  Administered 2014-11-25: 4 mg via INTRAVENOUS

## 2014-11-25 MED ORDER — CEFTRIAXONE SODIUM IN DEXTROSE 20 MG/ML IV SOLN
1.0000 g | INTRAVENOUS | Status: DC
  Filled 2014-11-25: qty 50

## 2014-11-25 MED ORDER — HYDROCODONE-ACETAMINOPHEN 5-325 MG PO TABS: 1.0000 | ORAL_TABLET | ORAL | Status: DC | PRN

## 2014-11-25 MED ORDER — CEFTRIAXONE SODIUM 2 G IJ SOLR
INTRAMUSCULAR | Status: AC
  Filled 2014-11-25: qty 2

## 2014-11-25 MED ORDER — MIDAZOLAM HCL 2 MG/2ML IJ SOLN
INTRAMUSCULAR | Status: AC
  Filled 2014-11-25: qty 2

## 2014-11-25 MED ORDER — PROPOFOL 10 MG/ML IV BOLUS
INTRAVENOUS | Status: DC | PRN
  Administered 2014-11-25: 200 mg via INTRAVENOUS

## 2014-11-25 MED ORDER — KETOROLAC TROMETHAMINE 30 MG/ML IJ SOLN
30.0000 mg | Freq: Once | INTRAMUSCULAR | Status: DC | PRN
  Filled 2014-11-25: qty 1

## 2014-11-25 MED ORDER — FENTANYL CITRATE (PF) 100 MCG/2ML IJ SOLN
25.0000 ug | INTRAMUSCULAR | Status: DC | PRN
  Filled 2014-11-25: qty 1

## 2014-11-25 MED ORDER — MIDAZOLAM HCL 5 MG/5ML IJ SOLN
INTRAMUSCULAR | Status: DC | PRN
  Administered 2014-11-25: 2 mg via INTRAVENOUS

## 2014-11-25 MED ORDER — CEPHALEXIN 500 MG PO CAPS: 500.0000 mg | ORAL_CAPSULE | Freq: Two times a day (BID) | ORAL | Status: DC

## 2014-11-25 MED ORDER — KETOROLAC TROMETHAMINE 30 MG/ML IJ SOLN
INTRAMUSCULAR | Status: DC | PRN
  Administered 2014-11-25: 30 mg via INTRAVENOUS

## 2014-11-25 MED ORDER — SODIUM CHLORIDE 0.9 % IR SOLN
Status: DC | PRN
  Administered 2014-11-25: 3000 mL
  Administered 2014-11-25: 1000 mL

## 2014-11-25 MED ORDER — LACTATED RINGERS IV SOLN
INTRAVENOUS | Status: DC
  Administered 2014-11-25 (×2): via INTRAVENOUS
  Filled 2014-11-25: qty 1000

## 2014-11-25 MED ORDER — EPHEDRINE SULFATE 50 MG/ML IJ SOLN
INTRAMUSCULAR | Status: DC | PRN
  Administered 2014-11-25: 10 mg via INTRAVENOUS

## 2014-11-25 MED ORDER — PROMETHAZINE HCL 25 MG/ML IJ SOLN
6.2500 mg | INTRAMUSCULAR | Status: DC | PRN
  Filled 2014-11-25: qty 1

## 2014-11-25 MED ORDER — IOHEXOL 350 MG/ML SOLN
INTRAVENOUS | Status: DC | PRN
  Administered 2014-11-25: 10 mL

## 2014-11-25 MED ORDER — CEFTRIAXONE SODIUM IN DEXTROSE 40 MG/ML IV SOLN
2.0000 g | INTRAVENOUS | Status: AC
  Administered 2014-11-25: 2 g via INTRAVENOUS
  Filled 2014-11-25: qty 50

## 2014-11-25 MED ORDER — LIDOCAINE HCL (CARDIAC) 20 MG/ML IV SOLN
INTRAVENOUS | Status: DC | PRN
  Administered 2014-11-25: 80 mg via INTRAVENOUS

## 2014-11-25 SURGICAL SUPPLY — 43 items
BAG DRAIN URO-CYSTO SKYTR STRL (DRAIN) ×4 IMPLANT
BAG DRN UROCATH (DRAIN) ×1
BAG URO CATCHER STRL LF (DRAPE) ×4 IMPLANT
BASKET LASER NITINOL 1.9FR (BASKET) ×4 IMPLANT
BASKET STNLS GEMINI 4WIRE 3FR (BASKET) IMPLANT
BASKET ZERO TIP NITINOL 2.4FR (BASKET) IMPLANT
BSKT STON RTRVL 120 1.9FR (BASKET) ×1
BSKT STON RTRVL GEM 120X11 3FR (BASKET)
CANISTER SUCT LVC 12 LTR MEDI- (MISCELLANEOUS) ×4 IMPLANT
CATH INTERMIT  6FR 70CM (CATHETERS) ×4 IMPLANT
CATH URET 5FR 28IN CONE TIP (BALLOONS)
CATH URET 5FR 28IN OPEN ENDED (CATHETERS) IMPLANT
CATH URET 5FR 70CM CONE TIP (BALLOONS) IMPLANT
CLOTH BEACON ORANGE TIMEOUT ST (SAFETY) ×4 IMPLANT
ELECT REM PT RETURN 9FT ADLT (ELECTROSURGICAL)
ELECTRODE REM PT RTRN 9FT ADLT (ELECTROSURGICAL) IMPLANT
FIBER LASER FLEXIVA 365 (UROLOGICAL SUPPLIES) IMPLANT
FIBER LASER TRAC TIP (UROLOGICAL SUPPLIES) ×4 IMPLANT
GLOVE BIO SURGEON STRL SZ7 (GLOVE) ×4 IMPLANT
GLOVE BIO SURGEON STRL SZ7.5 (GLOVE) ×4 IMPLANT
GLOVE BIOGEL PI IND STRL 7.5 (GLOVE) ×2 IMPLANT
GLOVE BIOGEL PI INDICATOR 7.5 (GLOVE) ×2
GLOVE SURG SS PI 7.5 STRL IVOR (GLOVE) ×16 IMPLANT
GOWN STRL REUS W/ TWL LRG LVL3 (GOWN DISPOSABLE) IMPLANT
GOWN STRL REUS W/ TWL XL LVL3 (GOWN DISPOSABLE) ×4 IMPLANT
GOWN STRL REUS W/TWL LRG LVL3 (GOWN DISPOSABLE)
GOWN STRL REUS W/TWL XL LVL3 (GOWN DISPOSABLE) ×4
GUIDEWIRE 0.038 PTFE COATED (WIRE) IMPLANT
GUIDEWIRE ANG ZIPWIRE 038X150 (WIRE) ×4 IMPLANT
GUIDEWIRE STR DUAL SENSOR (WIRE) ×4 IMPLANT
IV NS 1000ML (IV SOLUTION) ×2
IV NS 1000ML BAXH (IV SOLUTION) ×2 IMPLANT
IV NS IRRIG 3000ML ARTHROMATIC (IV SOLUTION) ×4 IMPLANT
KIT BALLIN UROMAX 15FX10 (LABEL) IMPLANT
KIT BALLN UROMAX 15FX4 (MISCELLANEOUS) IMPLANT
KIT BALLN UROMAX 26 75X4 (MISCELLANEOUS)
MANIFOLD NEPTUNE II (INSTRUMENTS) IMPLANT
PACK CYSTO (CUSTOM PROCEDURE TRAY) ×4 IMPLANT
SET HIGH PRES BAL DIL (LABEL)
STENT URET 6FRX24 CONTOUR (STENTS) ×4 IMPLANT
SYRINGE 10CC LL (SYRINGE) ×4 IMPLANT
SYRINGE IRR TOOMEY STRL 70CC (SYRINGE) IMPLANT
TUBE FEEDING 8FR 16IN STR KANG (MISCELLANEOUS) IMPLANT

## 2014-11-25 NOTE — Anesthesia Preprocedure Evaluation (Addendum)
Anesthesia Evaluation  Patient identified by MRN, date of birth, ID band Patient awake    Reviewed: Allergy & Precautions, NPO status , Patient's Chart, lab work & pertinent test results  Airway Mallampati: II  TM Distance: >3 FB Neck ROM: Full    Dental no notable dental hx. (+) Teeth Intact, Dental Advisory Given   Pulmonary neg pulmonary ROS, former smoker,  breath sounds clear to auscultation  Pulmonary exam normal       Cardiovascular Exercise Tolerance: Good hypertension, Pt. on medications Normal cardiovascular examRhythm:Regular Rate:Normal     Neuro/Psych negative neurological ROS  negative psych ROS   GI/Hepatic negative GI ROS, Neg liver ROS,   Endo/Other  negative endocrine ROS  Renal/GU negative Renal ROS  negative genitourinary   Musculoskeletal negative musculoskeletal ROS (+)   Abdominal   Peds negative pediatric ROS (+)  Hematology negative hematology ROS (+)   Anesthesia Other Findings Invisaline retainer in place  Reproductive/Obstetrics negative OB ROS                          Anesthesia Physical Anesthesia Plan  ASA: II  Anesthesia Plan: General   Post-op Pain Management:    Induction: Intravenous  Airway Management Planned: LMA  Additional Equipment:   Intra-op Plan:   Post-operative Plan:   Informed Consent: I have reviewed the patients History and Physical, chart, labs and discussed the procedure including the risks, benefits and alternatives for the proposed anesthesia with the patient or authorized representative who has indicated his/her understanding and acceptance.   Dental advisory given  Plan Discussed with: CRNA and Surgeon  Anesthesia Plan Comments:         Anesthesia Quick Evaluation

## 2014-11-25 NOTE — Discharge Instructions (Signed)
1 - You may have urinary urgency (bladder spasms) and bloody urine on / off with stent in place. This is normal.  2 - Call MD or go to ER for fever >102, severe pain / nausea / vomiting not relieved by medications, or acute change in medical status  3 - Remove tethered stent on Monday morning at home by pulling on string, the blue plastic tubing, and discarding. Dr. Berneice Heinrich is in the office Monday if any issues arise. [facility] [phone] Post Ureteroscopy With or Without Stent Instructions  Definitions:  Ureter: The duct that transports urine from the kidney to the bladder. Stent:   A plastic hollow tube that is placed into the ureter, from the kidney to the                 bladder to prevent the ureter from swelling shut.  GENERAL INSTRUCTIONS:  Despite the fact that no skin incisions were used, the area around the ureter and bladder is raw and irritated. The stent is a foreign body which will further irritate the bladder wall. This irritation is manifested by increased frequency of urination, both day and night, and by an increase in the urge to urinate. In some, the urge to urinate is present almost always. Sometimes the urge is strong enough that you may not be able to stop yourself from urinating. The only real cure is to remove the stent and then give time for the bladder wall to heal which can't be done until the danger of the ureter swelling shut has passed, which varies.  You may see some blood in your urine while the stent is in place and a few days afterwards. Do not be alarmed, even if the urine was clear for a while. Get off your feet and drink lots of fluids until clearing occurs. If you start to pass clots or don't improve, call us.  DIET: You may return to your normal diet immediately. Because of the raw surface of your bladder, alcohol, spicy foods, acid type foods and drinks with caffeine may cause irritation or frequency and should be used in moderation. To  keep your urine flowing freely and to avoid constipation, drink plenty of fluids during the day ( 8-10 glasses ). Tip: Avoid cranberry juice because it is very acidic.  ACTIVITY: Your physical activity doesn't need to be restricted. However, if you are very active, you may see some blood in your urine. We suggest that you reduce your activity under these circumstances until the bleeding has stopped.  BOWELS: It is important to keep your bowels regular during the postoperative period. Straining with bowel movements can cause bleeding. A bowel movement every other day is reasonable. Use a mild laxative if needed, such as Milk of Magnesia 2-3 tablespoons, or 2 Dulcolax tablets. Call if you continue to have problems. If you have been taking narcotics for pain, before, during or after your surgery, you may be constipated. Take a laxative if necessary.   MEDICATION: You should resume your pre-surgery medications unless told not to. In addition you will often be given an antibiotic to prevent infection. These should be taken as prescribed until the bottles are finished unless you are having an unusual reaction to one of the drugs.  PROBLEMS YOU SHOULD REPORT TO [place]:  Fevers over 100.5 Fahrenheit.  Heavy bleeding, or clots ( See above notes about blood in urine ).  Inability to urinate.  Drug reactions ( hives, rash, nausea, vomiting, diarrhea ).  Severe burning or pain with urination that is not improving.  FOLLOW-UP: You will need a follow-up appointment to monitor your progress. Call for this appointment at the number listed above. Usually the first appointment will be about three to fourteen days after your surgery.      Post Anesthesia Home Care Instructions  Activity: Get plenty of rest for the remainder of the day. A responsible adult should stay with you for 24 hours following the procedure.  For the next 24 hours, DO NOT: -Drive a car -Paediatric nurse -Drink alcoholic  beverages -Take any medication unless instructed by your physician -Make any legal decisions or sign important papers.  Meals: Start with liquid foods such as gelatin or soup. Progress to regular foods as tolerated. Avoid greasy, spicy, heavy foods. If nausea and/or vomiting occur, drink only clear liquids until the nausea and/or vomiting subsides. Call your physician if vomiting continues.  Special Instructions/Symptoms: Your throat may feel dry or sore from the anesthesia or the breathing tube placed in your throat during surgery. If this causes discomfort, gargle with warm salt water. The discomfort should disappear within 24 hours.  If you had a scopolamine patch placed behind your ear for the management of post- operative nausea and/or vomiting:  1. The medication in the patch is effective for 72 hours, after which it should be removed.  Wrap patch in a tissue and discard in the trash. Wash hands thoroughly with soap and water. 2. You may remove the patch earlier than 72 hours if you experience unpleasant side effects which may include dry mouth, dizziness or visual disturbances. 3. Avoid touching the patch. Wash your hands with soap and water after contact with the patch.

## 2014-11-25 NOTE — Anesthesia Procedure Notes (Signed)
Procedure Name: LMA Insertion Date/Time: 11/25/2014 11:18 AM Performed by: Tyrone Nine Pre-anesthesia Checklist: Patient identified, Timeout performed, Emergency Drugs available, Suction available and Patient being monitored Patient Re-evaluated:Patient Re-evaluated prior to inductionOxygen Delivery Method: Circle system utilized Preoxygenation: Pre-oxygenation with 100% oxygen Intubation Type: IV induction Ventilation: Mask ventilation without difficulty LMA: LMA inserted LMA Size: 4.0 Number of attempts: 1 Placement Confirmation: breath sounds checked- equal and bilateral and positive ETCO2 Tube secured with: Tape Dental Injury: Teeth and Oropharynx as per pre-operative assessment

## 2014-11-25 NOTE — Anesthesia Postprocedure Evaluation (Signed)
  Anesthesia Post-op Note  Patient: [name]  Procedure(s) Performed: Procedure(s) (LRB): CYSTOSCOPY WITH RIGHT URETEROSCOPY AND STENT REPLACEMENT (Right) HOLMIUM LASER APPLICATION (Right) CYSTOSCOPY WITH RETROGRADE PYELOGRAM (Right) STONE EXTRACTION WITH BASKET  Patient Location: PACU  Anesthesia Type: General  Level of Consciousness: awake and alert   Airway and Oxygen Therapy: Patient Spontanous Breathing  Post-op Pain: mild  Post-op Assessment: Post-op Vital signs reviewed, Patient's Cardiovascular Status Stable, Respiratory Function Stable, Patent Airway and No signs of Nausea or vomiting  Last Vitals:  Filed Vitals:   11/25/14 1245  BP: 124/94  Pulse: 78  Temp:   Resp: 11    Post-op Vital Signs: stable   Complications: No apparent anesthesia complications

## 2014-11-25 NOTE — H&P (Signed)
[name] is an 49 y.o. male.    Chief Complaint: Pre-op Right Ureteroscopic stone manipulation  HPI:   1 - Recurrent Nephrolithiasis -  2011 - SWL x2 10/2014 - Rt 4mm prox ureteral with hydro (SSD 11cm, 500HU) and 5mm ipsilateral lower pole by CT in ER 10/27/14. Not seen on scout images due to bowel gas.   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  2016 - renogram with symmeteric function and only slihgt left delay, T1/2 17 min  3 - Renal Insufficiency - cr 1.8 noted on ER labs during eval for right ureteral stone. Non-diabetic.   PMH unremarkable. NO CV disease. 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 NO interval fevers of stone passage.   Past Medical History  Diagnosis Date  . Hypertension   . History of kidney stones   . Renal calculus, right   . Glaucoma   . Urgency of urination   . Wears glasses   . At risk for sleep apnea     STOP-BANG= 4    SENT TO PCP  11-23-2014    Past Surgical History  Procedure Laterality Date  . Cystoscopy with retrograde pyelogram, ureteroscopy and stent placement Right 11/02/2014    Procedure: CYSTOSCOPY WITH RIGHT  RETROGRADE PYELOGRAM, diagnostic URETEROSCOPY AND STENT PLACEMENT;  Surgeon: Sebastian Ache, MD;  Location: [facility];  Service: Urology;  Laterality: Right;  . Extracorporeal shock wave lithotripsy  2005  &  07-10-2009  . Endopyelotomy Left 2002    for Pelviureteric Junction Obstruction    History reviewed. No pertinent family history. Social History:  reports that he quit smoking about 20 years ago. His smoking use included Cigarettes. He quit after .5 years of use. He has never used smokeless tobacco. He reports that he drinks alcohol. He reports that he does not use illicit drugs.  Allergies: No Known Allergies  No prescriptions prior to admission    No results found for this or any previous visit (from the past 48  hour(s)). No results found.  Review of Systems  Constitutional: Negative.  Negative for fever and chills.  HENT: Negative.   Eyes: Negative.   Respiratory: Negative.   Cardiovascular: Negative.   Gastrointestinal: Negative.   Genitourinary: Positive for flank pain.  Musculoskeletal: Negative.   Skin: Negative.   Neurological: Negative.   Endo/Heme/Allergies: Negative.   Psychiatric/Behavioral: Negative.     Height 5\' 3"  (1.6 m), weight 63.504 kg (140 lb). Physical Exam  Constitutional: He appears well-developed.  HENT:  Head: Normocephalic.  Eyes: Pupils are equal, round, and reactive to light.  Neck: Normal range of motion.  Cardiovascular: Normal rate.   Respiratory: Effort normal.  GI: Soft.  Genitourinary:  Mild Rt CVAT  Musculoskeletal: Normal range of motion.  Neurological: He is alert.  Skin: Skin is warm.  Psychiatric: He has a normal mood and affect. His behavior is normal. Judgment and thought content normal.     Assessment/Plan   1 - Recurrent Nephrolithiasis -  We rediscussed ureteroscopic stone manipulation with basketing and laser-lithotripsy in detail.  We discussed risks including bleeding, infection, damage to kidney / ureter  bladder, rarely loss of kidney. We rediscussed anesthetic risks and rare but serious surgical complications including DVT, PE, MI, and mortality. We specifically readdressed that in 5-10% of cases a staged approach is required with stenting followed by re-attempt ureteroscopy if anatomy  unfavorable.   The patient voiced understanding and wishes to proceed today as planned.    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     Arina Torry 11/25/2014, 6:41 AM

## 2014-11-25 NOTE — Transfer of Care (Signed)
Immediate Anesthesia Transfer of Care Note  Patient: [name]  Procedure(s) Performed: Procedure(s): CYSTOSCOPY WITH RIGHT URETEROSCOPY AND STENT REPLACEMENT (Right) HOLMIUM LASER APPLICATION (Right) CYSTOSCOPY WITH RETROGRADE PYELOGRAM (Right) STONE EXTRACTION WITH BASKET  Patient Location: PACU  Anesthesia Type:General  Level of Consciousness: awake, alert , oriented and patient cooperative  Airway & Oxygen Therapy: Patient Spontanous Breathing and Patient connected to nasal cannula oxygen  Post-op Assessment: Report given to RN and Post -op Vital signs reviewed and stable  Post vital signs: Reviewed and stable  Last Vitals:  Filed Vitals:   11/25/14 0955  BP: 117/84  Pulse: 67  Temp: 37.1 C  Resp: 16    Complications: No apparent anesthesia complications

## 2014-11-25 NOTE — Brief Op Note (Signed)
11/25/2014  12:05 PM  PATIENT:  [name]  49 y.o. male  PRE-OPERATIVE DIAGNOSIS:  RIGHT RENAL STONES  POST-OPERATIVE DIAGNOSIS:  RIGHT RENAL STONES  PROCEDURE:  Procedure(s): CYSTOSCOPY WITH RIGHT URETEROSCOPY AND STENT REPLACEMENT (Right) HOLMIUM LASER APPLICATION (Right) CYSTOSCOPY WITH RETROGRADE PYELOGRAM (Right) STONE EXTRACTION WITH BASKET  SURGEON:  Surgeon(s) and Role:    * Sebastian Ache, MD - Primary  PHYSICIAN ASSISTANT:   ASSISTANTS: none   ANESTHESIA:   general  EBL:  Total I/O In: 500 [I.V.:500] Out: -   BLOOD ADMINISTERED:none  DRAINS: none   LOCAL MEDICATIONS USED:  NONE  SPECIMEN:  Source of Specimen:  Rt renal and ureteral stone fragments  DISPOSITION OF SPECIMEN:  [facility] for compositional analysis  COUNTS:  YES  TOURNIQUET:  * No tourniquets in log *  DICTATION: .Other Dictation: Dictation Number [id number]  PLAN OF CARE: Discharge to home after PACU  PATIENT DISPOSITION:  PACU - hemodynamically stable.   Delay start of Pharmacological VTE agent (>24hrs) due to surgical blood loss or risk of bleeding: yes

## 2014-11-28 ENCOUNTER — Encounter (HOSPITAL_BASED_OUTPATIENT_CLINIC_OR_DEPARTMENT_OTHER): Payer: Self-pay | Admitting: Urology

## 2014-11-28 NOTE — Op Note (Signed)
NAME:  VERNIS, EID NO.:  [id number]  MEDICAL RECORD NO.:  [id number]  LOCATION:                               FACILITY:  [facility]  PHYSICIAN:  Sebastian Ache, MD     DATE OF BIRTH:  1965/10/09  DATE OF PROCEDURE:  11/25/2014                               OPERATIVE REPORT   DIAGNOSIS:  Right renal and ureteral stones.  PROCEDURES: 1. Cystoscopy with right retrograde pyelogram and interpretation. 2. Exchange of right ureteral stent, 6 x 24, Contour with tether to     the dorsum of penis. 3. Right ureteroscopy with laser lithotripsy.  ESTIMATED BLOOD LOSS:  Nil.  COMPLICATIONS:  None.  SPECIMEN:  Right ureteral and renal stone fragments for compositional analysis.  FINDINGS: 1. Continued relative narrowing of the right proximal ureter and UPJ     without focal stricture, much improved after interval stenting. 2. Multifocal intrarenal stones including lower pole x2 and upper pole     x1.  It was felt that at least one of these were presented to the     retrograde positioning of prior proximal ureteral stone. 3. Complete resolution of all stone fragments larger than 1/3rd mm     following laser lithotripsy and basket extraction.  INDICATIONS:  Mr. Quiles is a very pleasant 49 year old gentleman who was found on workup of colicky flank pain to have a right proximal ureteral stone as well as multifocal right renal stones.  Options were discussed for management including medical therapy alone versus shockwave lithotripsy versus ureteroscopy, and he wished to proceed with the latter.  He underwent first-stage procedure several weeks ago; however, proximal ureter was quite narrow and did not allow advancement of the ureteroscope to the level of the stone.  As such, he underwent interval stenting to allow for passive dilation and presents for second- stage procedure today.  Informed consent was obtained and placed in the medical record.  No interval  fevers.  PROCEDURE IN DETAIL:  The patient being [name], was verified. Procedure being right ureteroscopy with laser lithotripsy and stent exchange in retrograde was confirmed.  Procedure was carried out.  Time- out was performed.  Intravenous antibiotics were administered.  General LMA anesthesia was introduced.  The patient was placed into a low lithotomy position and sterile field was created by prepping and draping the patient's penis, perineum, and proximal thighs using iodine x3. Next, cystourethroscopy was performed using a 23-French rigid cystoscope with 30-degree offset lens.  Inspection of the anterior and posterior urethra were unremarkable.  Inspection of the urinary bladder revealed a distal end of right ureteral stent in situ.  This was grasped with cold graspers and brought to the level of the urethral meatus, through which, a 0.038 zip wire was advanced at the level of the upper pole and set aside as a safety wire.  Next, this was exchanged for an open-ended catheter and right retrograde pyelogram was obtained.  Right retrograde pyelogram demonstrated a single right ureter with single-system right kidney.  There was some mild relative narrowing at the area of the UPJ, which easily did allow for contrast to fill the renal pelvis.  There were no obvious filling defects.  The zip wire was advanced again to the upper pole as a safety wire.  An 8-French feeding tube was placed in the urinary bladder for pressure release.  Next, semi- rigid ureteroscopy was performed at the entire length of right ureter, alongside a separate Sensor working wire and there was some mild relative narrowing at the area of the UPJ, but this did allow easy passage of the two wires and semi-rigid ureteroscope, and this was much more favorable than several weeks ago.  When it was not pre-stented, the semi-rigid ureteroscope was then exchanged for 24-cm 12/14 ureteral access sheath that was  placed in the proximal ureter using fluoroscopic guidance.  Next, flexible digital ureteroscopy was performed using a 5- French flexible single-channel ureteroscope.  Inspection of the proximal ureter and systematic inspection of the kidney x2 revealed multifocal renal stones including two lower pole stones and one upper pole stone. At least one of these felt likely represent retrograde positioning of prior ureteral stones, this appeared to be much too large for simple basketing.  As such, holmium laser energy was applied to the stone using settings of 0.2 joules and 20 hertz with a 200-nanometer fiber, fragmenting these stones into fragments approximately 1-2 mm in diameter.  These were then sequentially grasped with an escape-type basket and brought out in their entirety and set aside for compositional analysis.  Following these maneuvers, there was complete resolution of all stone fragments larger than 1/3 mm, no evidence of renal perforation.  There was excellent hemostasis.  The access sheath was removed under continuous uteroscopic vision and no mucosal abnormalities were found.  Finally, a new 6 x 24 Contour-type stent was placed using fluoroscopic guidance over the remaining safety wire.  Good proximal and distal deployment were noted.  The tether was fashioned to the dorsum of the penis.  Procedure was terminated.  The patient tolerated the procedure well.  There were no immediate periprocedural complications. The patient was taken to the postanesthesia care unit in stable condition.          ______________________________ Sebastian Ache, MD     TM/MEDQ  D:  11/25/2014  T:  11/25/2014  Job:  [id number]

## 2016-01-25 IMAGING — CT CT ABD-PELV W/ CM
2 of 5 series · 15 of 46 positions shown, 17 images · IV contrast (OMNIPAQUE 300)
Comparison: Abdominal radiograph performed 06/28/2014

CLINICAL DATA: Acute onset right lower quadrant abdominal pain for
2 days. Low-grade fever. Initial encounter.

EXAM:
CT ABDOMEN AND PELVIS WITH CONTRAST
TECHNIQUE: Multidetector CT imaging of the abdomen and pelvis was performed
using the standard protocol following bolus administration of
intravenous contrast.
CONTRAST:  100mL OMNIPAQUE IOHEXOL 300 MG/ML  SOLN

[Series 2: abd/pel with · axial · 0.73mm/px · z∈[+1076,+1426]mm · 12 of 80 slices shown, 14 images]
[im 5/80  soft-tissue]
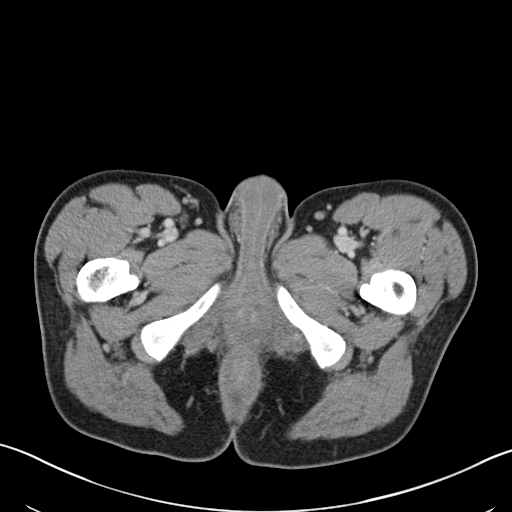
[im 5/80  bone]
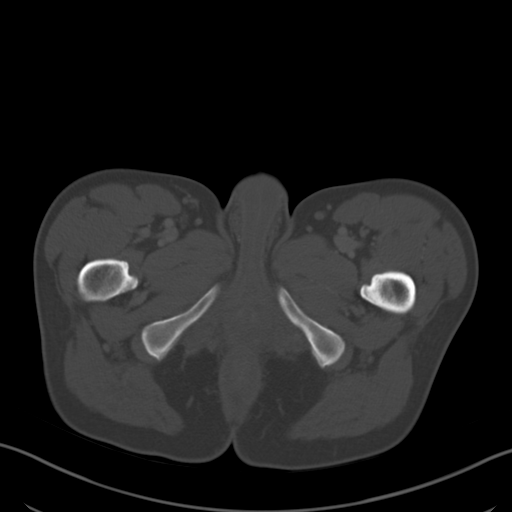
[im 14/80  soft-tissue]
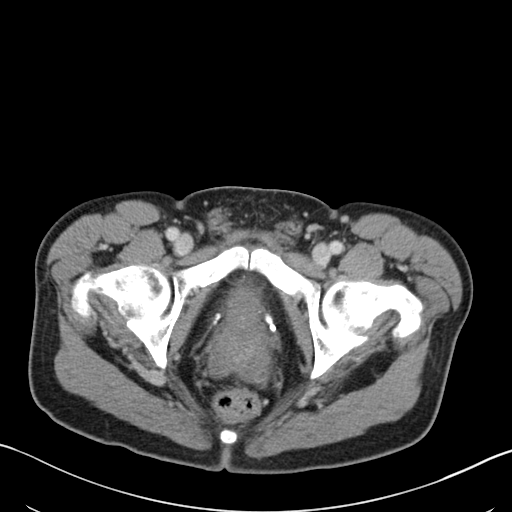
[im 19/80  soft-tissue]
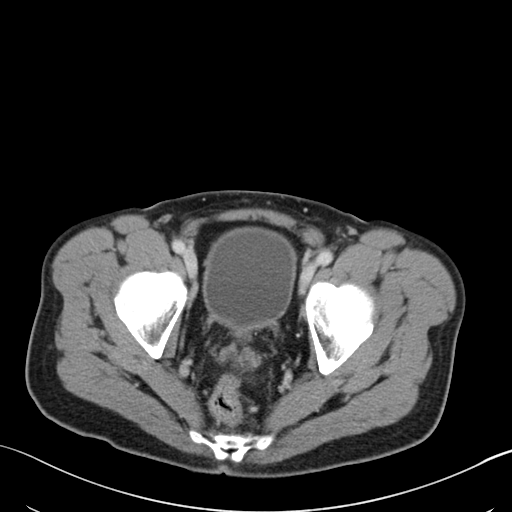
[im 24/80  soft-tissue]
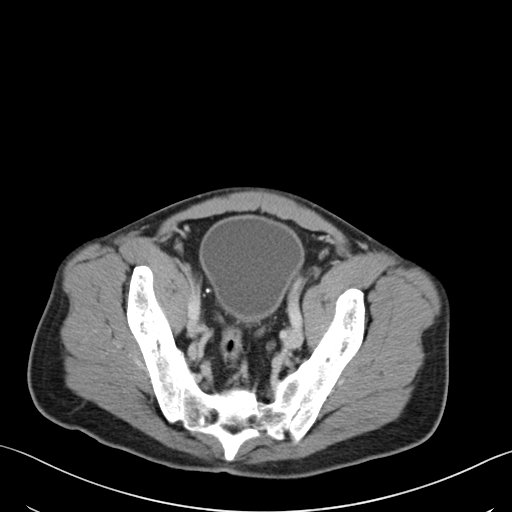
[im 33/80  soft-tissue]
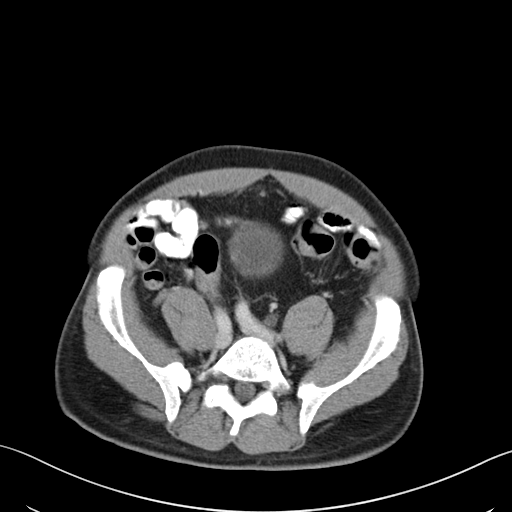
[im 38/80  soft-tissue]
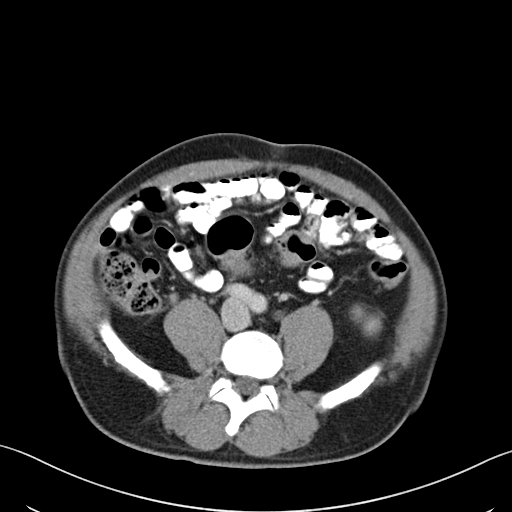
[im 42/80  soft-tissue]
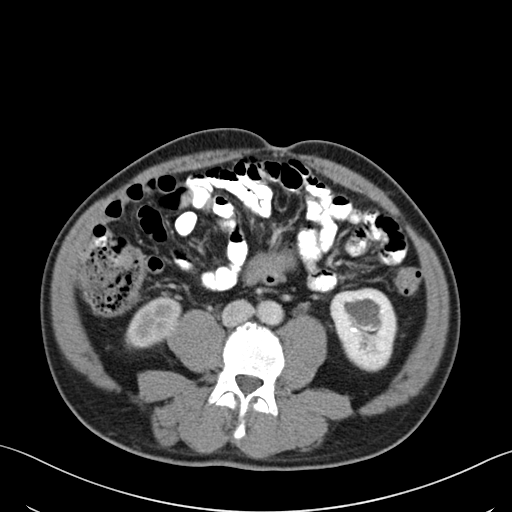
[im 52/80  soft-tissue]
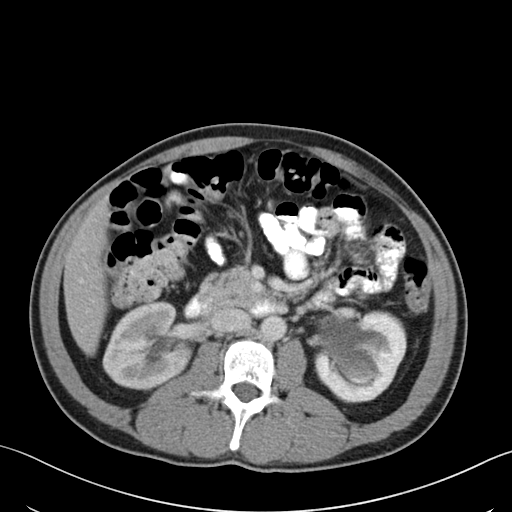
[im 56/80  soft-tissue]
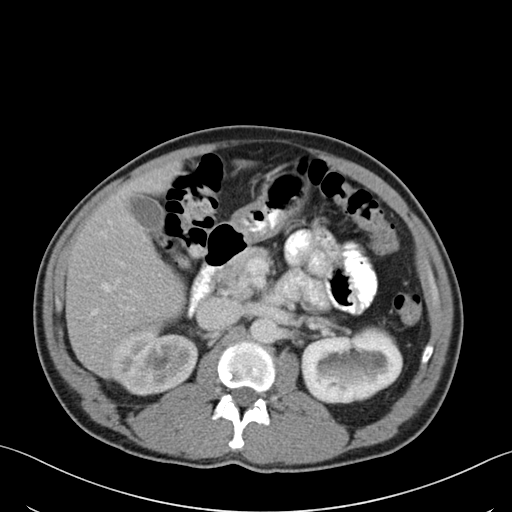
[im 56/80  bone]
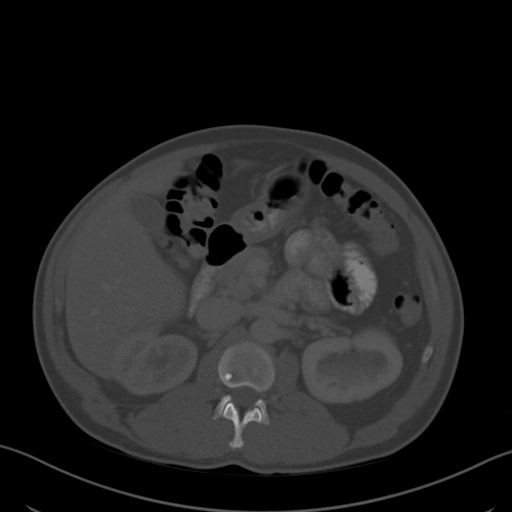
[im 61/80  soft-tissue]
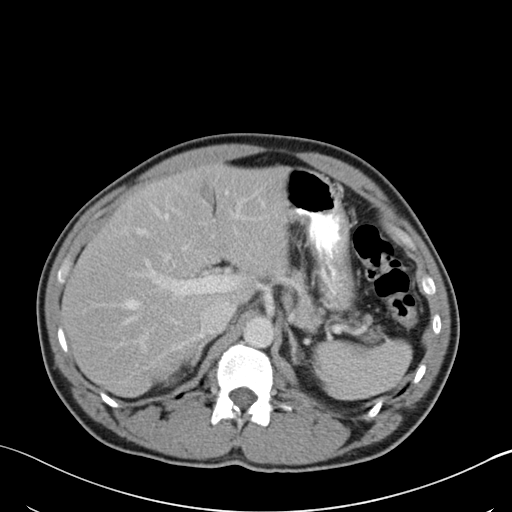
[im 70/80  soft-tissue]
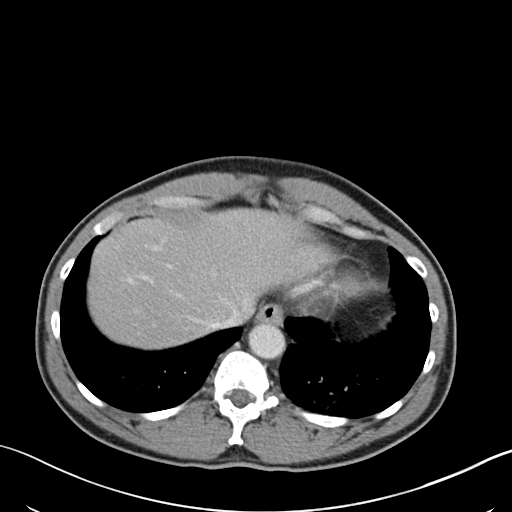
[im 75/80  soft-tissue]
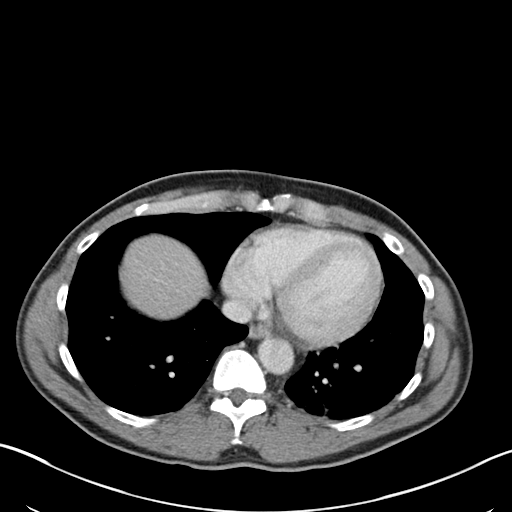

[Series 3: coronal a/|p · coronal · 0.63mm/px · 3 of 90 slices shown]
[im 30/90  soft-tissue]
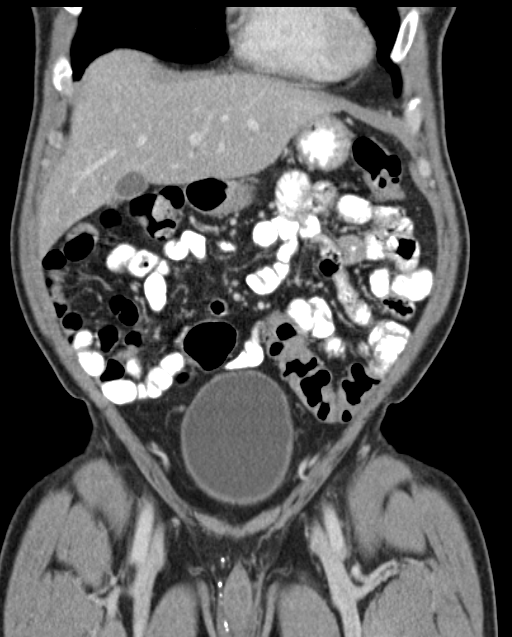
[im 40/90  soft-tissue]
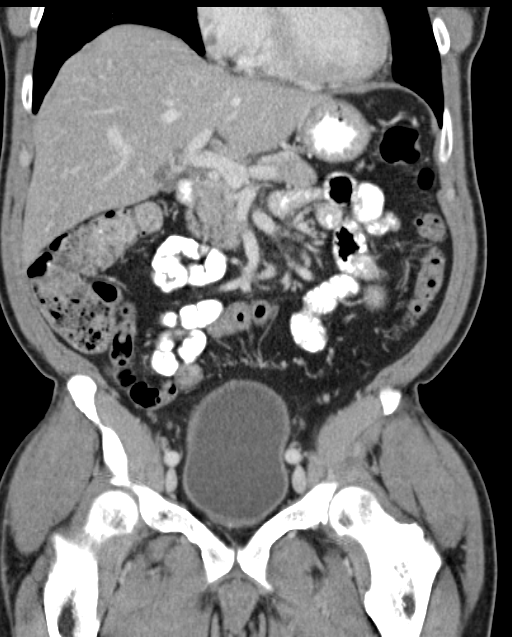
[im 50/90  soft-tissue]
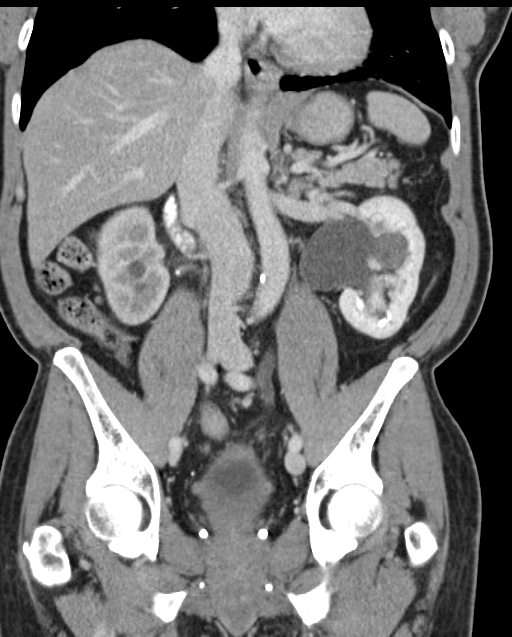

[15 of 46 positions shown; findings below may reference images not displayed]

FINDINGS: Mild patchy left basilar opacity may reflect atelectasis or mild
pneumonia.

The liver and spleen are unremarkable in appearance. The gallbladder
is within normal limits. The pancreas and adrenal glands are
unremarkable.

There is mild right-sided hydronephrosis. Delayed images demonstrate
no evidence of contrast excretion on the right, concerning for renal
insufficiency secondary to obstruction. An obstructing 5 x 4 mm
stone is noted proximally at the right ureteropelvic junction. Two
nonobstructing stones are noted at the lower pole of the right
kidney, measuring up to 5 mm in size.

There is severe chronic left-sided hydronephrosis, with mild
underlying renal atrophy. There is diffuse distention of the left
ureter, without evidence of a distal obstructing stone. This may
reflect a distal stricture at the left vesicoureteral junction.

No free fluid is identified. The small bowel is unremarkable in
appearance. The stomach is within normal limits. No acute vascular
abnormalities are seen.

The appendix is normal in caliber and contains trace air, without
evidence of appendicitis. The colon is unremarkable in appearance.

The bladder is mildly distended and grossly unremarkable in
appearance. A small urachal remnant is incidentally seen. The
prostate remains normal in size, with central calcification. No
inguinal lymphadenopathy is seen.

No acute osseous abnormalities are identified.
IMPRESSION: 1. Mild right-sided hydronephrosis. Obstructing 5 x 4 mm stone noted
proximally at the right ureteropelvic junction. Delayed images
demonstrate no evidence of contrast excretion on the right,
concerning for renal insufficiency secondary to obstruction.
2. Severe chronic left-sided hydronephrosis, with mild underlying
left renal atrophy. Diffuse distention of the left ureter, without
evidence of a distal obstructing stone on the left. This may reflect
a distal stricture at the left vesicoureteral junction. Given
effective bilateral obstruction, there is significant concern for
impending renal failure. Urology consult is recommended.
3. Two nonobstructing right renal stones noted, measuring up to 5 mm
in size.
4. Mild patchy left basilar airspace opacity may reflect atelectasis
or mild pneumonia.

These results were called by telephone at the time of interpretation
on 10/27/2014 at [DATE] to Dr. ASNIK HUILING , who verbally
acknowledged these results.

## 2016-09-18 DIAGNOSIS — I10 Essential (primary) hypertension: Secondary | ICD-10-CM | POA: Diagnosis not present

## 2016-10-14 DIAGNOSIS — H1132 Conjunctival hemorrhage, left eye: Secondary | ICD-10-CM | POA: Diagnosis not present

## 2016-10-14 DIAGNOSIS — S0530XS Ocular laceration without prolapse or loss of intraocular tissue, unspecified eye, sequela: Secondary | ICD-10-CM | POA: Diagnosis not present

## 2016-10-14 DIAGNOSIS — H209 Unspecified iridocyclitis: Secondary | ICD-10-CM | POA: Diagnosis not present

## 2016-10-18 DIAGNOSIS — H1132 Conjunctival hemorrhage, left eye: Secondary | ICD-10-CM | POA: Diagnosis not present

## 2016-10-18 DIAGNOSIS — H209 Unspecified iridocyclitis: Secondary | ICD-10-CM | POA: Diagnosis not present

## 2016-11-07 DIAGNOSIS — H209 Unspecified iridocyclitis: Secondary | ICD-10-CM | POA: Diagnosis not present

## 2016-12-11 DIAGNOSIS — N200 Calculus of kidney: Secondary | ICD-10-CM | POA: Diagnosis not present

## 2016-12-11 DIAGNOSIS — N1330 Unspecified hydronephrosis: Secondary | ICD-10-CM | POA: Diagnosis not present

## 2016-12-11 DIAGNOSIS — Z125 Encounter for screening for malignant neoplasm of prostate: Secondary | ICD-10-CM | POA: Diagnosis not present

## 2016-12-19 DIAGNOSIS — H40053 Ocular hypertension, bilateral: Secondary | ICD-10-CM | POA: Diagnosis not present

## 2017-12-24 DIAGNOSIS — Z125 Encounter for screening for malignant neoplasm of prostate: Secondary | ICD-10-CM | POA: Diagnosis not present

## 2017-12-24 DIAGNOSIS — N200 Calculus of kidney: Secondary | ICD-10-CM | POA: Diagnosis not present

## 2017-12-24 DIAGNOSIS — I10 Essential (primary) hypertension: Secondary | ICD-10-CM | POA: Diagnosis not present

## 2018-03-09 DIAGNOSIS — H3589 Other specified retinal disorders: Secondary | ICD-10-CM | POA: Diagnosis not present

## 2018-03-09 DIAGNOSIS — H40053 Ocular hypertension, bilateral: Secondary | ICD-10-CM | POA: Diagnosis not present

## 2020-09-11 ENCOUNTER — Other Ambulatory Visit: Payer: Self-pay

## 2020-09-11 ENCOUNTER — Emergency Department (HOSPITAL_COMMUNITY)
Admission: EM | Admit: 2020-09-11 | Discharge: 2020-09-11 | Disposition: A | Discharge status: Home / Self Care | Payer: BLUE CROSS/BLUE SHIELD | Attending: Emergency Medicine | Admitting: Emergency Medicine

## 2020-09-11 DIAGNOSIS — I10 Essential (primary) hypertension: Secondary | ICD-10-CM | POA: Insufficient documentation

## 2020-09-11 DIAGNOSIS — G510 Bell's palsy: Secondary | ICD-10-CM | POA: Insufficient documentation

## 2020-09-11 DIAGNOSIS — R29810 Facial weakness: Secondary | ICD-10-CM | POA: Diagnosis present

## 2020-09-11 DIAGNOSIS — Z87891 Personal history of nicotine dependence: Secondary | ICD-10-CM | POA: Diagnosis not present

## 2020-09-11 DIAGNOSIS — Z79899 Other long term (current) drug therapy: Secondary | ICD-10-CM | POA: Insufficient documentation

## 2020-09-11 LAB — I-STAT CHEM 8, ED
BUN: 10 mg/dL (ref 6–20)
Calcium, Ion: 1.01 mmol/L — ABNORMAL LOW (ref 1.15–1.40)
Chloride: 105 mmol/L (ref 98–111)
Creatinine, Ser: 0.8 mg/dL (ref 0.61–1.24)
Glucose, Bld: 101 mg/dL — ABNORMAL HIGH (ref 70–99)
HCT: 48 % (ref 39.0–52.0)
Hemoglobin: 16.3 g/dL (ref 13.0–17.0)
Potassium: 3.9 mmol/L (ref 3.5–5.1)
Sodium: 140 mmol/L (ref 135–145)
TCO2: 24 mmol/L (ref 22–32)

## 2020-09-11 LAB — COMPREHENSIVE METABOLIC PANEL
ALT: 50 U/L — ABNORMAL HIGH (ref 0–44)
AST: 40 U/L (ref 15–41)
Albumin: 4.5 g/dL (ref 3.5–5.0)
Alkaline Phosphatase: 67 U/L (ref 38–126)
Anion gap: 10 (ref 5–15)
BUN: 9 mg/dL (ref 6–20)
CO2: 24 mmol/L (ref 22–32)
Calcium: 8.9 mg/dL (ref 8.9–10.3)
Chloride: 104 mmol/L (ref 98–111)
Creatinine, Ser: 0.91 mg/dL (ref 0.61–1.24)
GFR, Estimated: >60 mL/min (ref >60)
Glucose, Bld: 105 mg/dL — ABNORMAL HIGH (ref 70–99)
Potassium: 4 mmol/L (ref 3.5–5.1)
Sodium: 138 mmol/L (ref 135–145)
Total Bilirubin: 0.7 mg/dL (ref 0.3–1.2)
Total Protein: 8.2 g/dL — ABNORMAL HIGH (ref 6.5–8.1)

## 2020-09-11 LAB — PROTIME-INR
INR: 1 (ref 0.8–1.2)
Prothrombin Time: 13 seconds (ref 11.4–15.2)

## 2020-09-11 LAB — CBC WITH DIFFERENTIAL/PLATELET
Abs Immature Granulocytes: 0.01 10*3/uL (ref 0.00–0.07)
Basophils Absolute: 0.2 10*3/uL — ABNORMAL HIGH (ref 0.0–0.1)
Basophils Relative: 4 %
Eosinophils Absolute: 0.3 10*3/uL (ref 0.0–0.5)
Eosinophils Relative: 5 %
HCT: 46.6 % (ref 39.0–52.0)
Hemoglobin: 14.7 g/dL (ref 13.0–17.0)
Immature Granulocytes: 0 %
Lymphocytes Relative: 40 %
Lymphs Abs: 2 10*3/uL (ref 0.7–4.0)
MCH: 24.3 pg — ABNORMAL LOW (ref 26.0–34.0)
MCHC: 31.5 g/dL (ref 30.0–36.0)
MCV: 76.9 fL — ABNORMAL LOW (ref 80.0–100.0)
Monocytes Absolute: 0.4 10*3/uL (ref 0.1–1.0)
Monocytes Relative: 8 %
Neutro Abs: 2.2 10*3/uL (ref 1.7–7.7)
Neutrophils Relative %: 43 %
Platelets: 351 10*3/uL (ref 150–400)
RBC: 6.06 MIL/uL — ABNORMAL HIGH (ref 4.22–5.81)
RDW: 17.7 % — ABNORMAL HIGH (ref 11.5–15.5)
WBC: 5.1 10*3/uL (ref 4.0–10.5)
nRBC: 0 % (ref 0.0–0.2)

## 2020-09-11 MED ORDER — VALACYCLOVIR HCL 1 G PO TABS: 1000.0000 mg | ORAL_TABLET | Freq: Three times a day (TID) | ORAL | 0 refills | Status: AC

## 2020-09-11 MED ORDER — PREDNISONE 10 MG PO TABS: 60.0000 mg | ORAL_TABLET | Freq: Every day | ORAL | 0 refills | Status: AC

## 2020-09-11 NOTE — Discharge Instructions (Addendum)
It was a pleasure taking care of you here in the emergency department today.  Your exam today was consistent with Bell's palsy.  We have written you for a few medications to help with this.  #1-valacyclovir.  This is an antiviral medication. #2-prednisone.  This is an anti-inflammatory #3-please pick up any over-the-counter lubricating eyedrops.  Placed these frequently into your eyes throughout the day.  You may also get a eye ointment which is thicker.  Placed to the lower rim of your eyelid in the evenings.  You also need to tape your eye shut to ensure you do not get any ulcers to your eyes.  Follow-up with your primary care doctor within the next week  Return for any new or worsening symptoms

## 2020-09-11 NOTE — ED Provider Notes (Signed)
[name] EMERGENCY DEPARTMENT Provider Note   CSN: [id number] Arrival date & time: 09/11/20  0802    History Chief Complaint  Patient presents with  . Facial Droop    [name] is a 55 y.o. male with past medical history significant for hypertension who presents for evaluation of facial droop. He noticed at 6 AM this morning he had facial droop on the left side.  Patient states over the last few days he has noted tearing to his left eye. Has not noted any decrease in blinking to his left eye. LKN yesterday 2200.  Difficulty with word finding. No associated weakness or pain. He has never had anything like this previously.  No current paresthesias or weakness. Denies HA, fever, neck pain, neck stiffness, chest pain, shortness of breath abdominal diarrhea, dysuria.  Denies additional aggravating or alleviating factors.  History otained from patient, wife in room and past medical records.  No interpreter is used.  HPI     Past Medical History:  Diagnosis Date  . At risk for sleep apnea    STOP-BANG= 4    SENT TO PCP  11-23-2014  . Glaucoma   . History of kidney stones   . Hypertension   . Renal calculus, right   . Urgency of urination   . Wears glasses     There are no problems to display for this patient.   Past Surgical History:  Procedure Laterality Date  . CYSTOSCOPY W/ RETROGRADES Right 11/25/2014   Procedure: CYSTOSCOPY WITH RETROGRADE PYELOGRAM;  Surgeon: Sebastian Ache, MD;  Location: [facility];  Service: Urology;  Laterality: Right;  . CYSTOSCOPY WITH RETROGRADE PYELOGRAM, URETEROSCOPY AND STENT PLACEMENT Right 11/02/2014   Procedure: CYSTOSCOPY WITH RIGHT  RETROGRADE PYELOGRAM, diagnostic URETEROSCOPY AND STENT PLACEMENT;  Surgeon: Sebastian Ache, MD;  Location: [facility];  Service: Urology;  Laterality: Right;  . CYSTOSCOPY WITH URETEROSCOPY AND STENT PLACEMENT Right 11/25/2014   Procedure: CYSTOSCOPY WITH RIGHT  URETEROSCOPY AND STENT REPLACEMENT;  Surgeon: Sebastian Ache, MD;  Location: [facility];  Service: Urology;  Laterality: Right;  . 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 EXTRACORPOREAL SHOCK WAVE LITHOTRIPSY  2005  &  07-10-2009  . HOLMIUM LASER APPLICATION Right 11/25/2014   Procedure: HOLMIUM LASER APPLICATION;  Surgeon: Sebastian Ache, MD;  Location: [facility];  Service: Urology;  Laterality: Right;  . STONE EXTRACTION WITH BASKET  11/25/2014   Procedure: STONE EXTRACTION WITH BASKET;  Surgeon: Sebastian Ache, MD;  Location: [facility];  Service: Urology;;       No family history on file.  Social History   Tobacco Use  . Smoking status: Former Smoker    Years: 0.50    Types: Cigarettes    Quit date: 11/23/1994    Years since quitting: 25.8  . Smokeless tobacco: Never Used  Substance Use Topics  . Alcohol use: Yes    Comment: occasional  . Drug use: No    Home Medications Prior to Admission medications   Medication Sig Start Date End Date Taking? Authorizing Provider  predniSONE (DELTASONE) 10 MG tablet Take 6 tablets (60 mg total) by mouth daily for 7 days. 09/11/20 09/18/20 Yes Amylia Collazos A, PA-C  valACYclovir (VALTREX) 1000 MG tablet Take 1 tablet (1,000 mg total) by mouth 3 (three) times daily for 14 days. 09/11/20 09/25/20 Yes Lexiana Spindel A, PA-C  amLODipine (NORVASC) 10 MG tablet Take 10 mg by mouth every  morning.     [provider]  cephALEXin (KEFLEX) 500 MG capsule Take 1 capsule (500 mg total) by mouth 2 (two) times daily. X 3 days to prevent post-op infection 11/25/14   Sebastian Ache, MD  HYDROcodone-acetaminophen [facility]) 5-325 MG per tablet Take 1-2 tablets by mouth every 4 (four) hours as needed for severe pain. From kidney stone 11/25/14   Sebastian Ache, MD  latanoprost (XALATAN) 0.005 % ophthalmic solution Place 1 drop into both eyes at bedtime.    [provider]   Multiple Vitamin (MULTIVITAMIN WITH MINERALS) TABS tablet Take 1 tablet by mouth every morning.    [provider]  oxybutynin (DITROPAN) 5 MG tablet Take 1 tablet (5 mg total) by mouth every 8 (eight) hours as needed for bladder spasms. / stent discomfort. 11/02/14   Sebastian Ache, MD  tamsulosin (FLOMAX) 0.4 MG CAPS capsule Take 1 capsule (0.4 mg total) by mouth daily. To help prevent stent discomfort 11/02/14   Sebastian Ache, MD    Allergies    Patient has no known allergies.  Review of Systems   Review of Systems  Constitutional: Negative.   HENT: Negative.   Respiratory: Negative.   Cardiovascular: Negative.   Gastrointestinal: Negative.   Genitourinary: Negative.   Musculoskeletal: Negative.   Skin: Negative.   Neurological: Positive for facial asymmetry. Negative for dizziness, tremors, syncope, weakness, light-headedness and headaches.  All other systems reviewed and are negative.   Physical Exam Updated Vital Signs BP (!) 167/94   Pulse 75   Temp (!) 97.3 F (36.3 C) (Oral)   Resp (!) 22   SpO2 98%   Physical Exam Physical Exam  Constitutional: Pt is oriented to person, place, and time. Pt appears well-developed and well-nourished. No distress.  HENT:  Head: Normocephalic and atraumatic.  Mouth/Throat: Oropharynx is clear and moist.  Eyes: Conjunctivae and EOM are normal. Pupils are equal, round, and reactive to light. No scleral icterus. Tearing to left eye. No obvious ulceration to eye. No horizontal, vertical or rotational nystagmus  Neck: Normal range of motion. Neck supple.  Full active and passive ROM without pain No midline or paraspinal tenderness No nuchal rigidity or meningeal signs  Cardiovascular: Normal rate, regular rhythm and intact distal pulses.   Pulmonary/Chest: Effort normal and breath sounds normal. No respiratory distress. Pt has no wheezes. No rales.  Abdominal: Soft. Bowel sounds are normal. There is no tenderness. There is no  rebound and no guarding.  Musculoskeletal: Normal range of motion.  Lymphadenopathy:    No cervical adenopathy.  Neurological: Pt. is alert and oriented to person, place, and time. He has normal reflexes. No cranial nerve deficit.  Exhibits normal muscle tone. Coordination normal.  Mental Status:  Alert, oriented, thought content appropriate. Speech fluent without evidence of aphasia. Able to follow 2 step commands without difficulty.  Cranial Nerves:  II:  Peripheral visual fields grossly normal, pupils equal, round, reactive to light III,IV, VI: ptosis present on left, extra-ocular motions intact bilaterally  V,VII: Smile Asymmetric, Left sided facial droop including forehead, facial light touch sensation equal VIII: hearing grossly normal bilaterally  IX,X: midline uvula rise  XI: bilateral shoulder shrug equal and strong XII: midline tongue extension  Motor:  5/5 in upper and lower extremities bilaterally including strong and equal grip strength and dorsiflexion/plantar flexion Sensory: Pinprick and light touch normal in all extremities.  Deep Tendon Reflexes: 2+ and symmetric  Cerebellar: normal finger-to-nose with bilateral upper extremities Gait: normal gait and balance CV:  distal pulses palpable throughout   Skin: Skin is warm and dry. No rash noted. Pt is not diaphoretic.  NO vesicles Psychiatric: Pt has a normal mood and affect. Behavior is normal. Judgment and thought content normal.  Nursing note and vitals reviewed. ED Results / Procedures / Treatments   Labs (all labs ordered are listed, but only abnormal results are displayed) Labs Reviewed  CBC WITH DIFFERENTIAL/PLATELET - Abnormal; Notable for the following components:      Result Value   RBC 6.06 (*)    MCV 76.9 (*)    MCH 24.3 (*)    RDW 17.7 (*)    Basophils Absolute 0.2 (*)    All other components within normal limits  COMPREHENSIVE METABOLIC PANEL - Abnormal; Notable for the following components:   Glucose,  Bld 105 (*)    Total Protein 8.2 (*)    ALT 50 (*)    All other components within normal limits  I-STAT CHEM 8, ED - Abnormal; Notable for the following components:   Glucose, Bld 101 (*)    Calcium, Ion 1.01 (*)    All other components within normal limits  PROTIME-INR    EKG None  Radiology No results found.  Procedures Procedures   Medications Ordered in ED Medications - No data to display  ED Course  I have reviewed the triage vital signs and the nursing notes.  Pertinent labs & imaging results that were available during my care of the patient were reviewed by me and considered in my medical decision making (see chart for details).  54 with here for evaluation of left-sided facial droop.  He is afebrile, nonseptic, non-ill-appearing.  Last known normal at 2200.  Noticed facial droop to left side of face this morning at around 6 AM including forehead.  Has noted tearing to his left eye over the last few days.  No dysarthria, aphasia. Decreased blink on left eye with tearing to left eye.  Patient denies any visual field cuts or blurred vision.  Has no neck stiffness or neck rigidity.  Equal handgrips bilaterally with intact sensation.  Ambulatory without ataxic gait.  Labs personally reviewed and interpreted.  No significant abnormality.  Exam consistent with Bell's palsy given complete left-sided facial droop including forehead, eye.  Aside from his facial droop he has no focal neurologic deficits on exam.  He has no neck stiffness or neck rigidity.  No obvious infectious process.  No recent tick bites, medication changes.  No associated facial rash, facial pain.  Patient assessed by attending, Dr. Clarene Duke.  Agrees with assessment and Bell's palsy Dx.  Patient stable to DC home with symptomatic management as well as discuss drops to eyes, tape to eyes shut at night to ensure no corneal ulcerations form. No obvious ulcerations on exam today.  Low suspicion for acute CVA,  dissection, Facial zoster including ramsey hunt syndrome.  The patient has been appropriately medically screened and/or stabilized in the ED. I have low suspicion for any other emergent medical condition which would require further screening, evaluation or treatment in the ED or require inpatient management.  Patient is hemodynamically stable and in no acute distress.  Patient able to ambulate in department prior to ED.  Evaluation does not show acute pathology that would require ongoing or additional emergent interventions while in the emergency department or further inpatient treatment.  I have discussed the diagnosis with the patient and answered all questions.  Pain is been managed while in the emergency department and  patient has no further complaints prior to discharge.  Patient is comfortable with plan discussed in room and is stable for discharge at this time.  I have discussed strict return precautions for returning to the emergency department.  Patient was encouraged to follow-up with PCP/specialist refer to at discharge.    MDM Rules/Calculators/A&P                           Final Clinical Impression(s) / ED Diagnoses Final diagnoses:  Bell's palsy    Rx / DC Orders ED Discharge Orders         Ordered    predniSONE (DELTASONE) 10 MG tablet  Daily        09/11/20 1012    valACYclovir (VALTREX) 1000 MG tablet  3 times daily        09/11/20 1012           Donnalyn Juran A, PA-C 09/11/20 1016    Little, Ambrose Finlandachel Morgan, MD 09/17/20 1511

## 2020-09-11 NOTE — ED Triage Notes (Signed)
Pt presents to the ED with left sided facial droop. LKW 2200. Pt noticed his facial droop at 0600 this morning. Pt self administered unknown dose of Tylenol at 0700 this AM but denies head pain. Ambulatory with steady gait.

## 2020-09-11 NOTE — ED Notes (Signed)
PA Britni aware of pts sx, to bedside at this time.

## 2020-09-11 NOTE — ED Notes (Signed)
Pt was able to ambulate to and from the restroom.  

## 2020-09-11 NOTE — ED Notes (Signed)
Pt verbalizes understanding of discharge instructions. Opportunity for questions and answers were provided. Armband removed by staff, pt discharged from the ED.  

## 2021-07-09 ENCOUNTER — Other Ambulatory Visit: Payer: Self-pay | Admitting: Urology

## 2021-07-20 ENCOUNTER — Ambulatory Visit (HOSPITAL_BASED_OUTPATIENT_CLINIC_OR_DEPARTMENT_OTHER): Admit: 2021-07-20 | Payer: BLUE CROSS/BLUE SHIELD | Admitting: Urology

## 2021-07-20 ENCOUNTER — Encounter (HOSPITAL_BASED_OUTPATIENT_CLINIC_OR_DEPARTMENT_OTHER): Payer: Self-pay

## 2021-07-20 SURGERY — CYSTOURETEROSCOPY, WITH RETROGRADE PYELOGRAM AND STENT INSERTION
Anesthesia: General | Laterality: Bilateral

## 2022-02-08 ENCOUNTER — Emergency Department (HOSPITAL_BASED_OUTPATIENT_CLINIC_OR_DEPARTMENT_OTHER): Payer: BLUE CROSS/BLUE SHIELD

## 2022-02-08 ENCOUNTER — Encounter (HOSPITAL_BASED_OUTPATIENT_CLINIC_OR_DEPARTMENT_OTHER): Payer: Self-pay

## 2022-02-08 ENCOUNTER — Emergency Department (HOSPITAL_BASED_OUTPATIENT_CLINIC_OR_DEPARTMENT_OTHER)
Admission: EM | Admit: 2022-02-08 | Discharge: 2022-02-08 | Disposition: A | Discharge status: Home / Self Care | Payer: BLUE CROSS/BLUE SHIELD | Attending: Emergency Medicine | Admitting: Emergency Medicine

## 2022-02-08 ENCOUNTER — Other Ambulatory Visit: Payer: Self-pay

## 2022-02-08 DIAGNOSIS — R200 Anesthesia of skin: Secondary | ICD-10-CM | POA: Insufficient documentation

## 2022-02-08 DIAGNOSIS — I10 Essential (primary) hypertension: Secondary | ICD-10-CM | POA: Insufficient documentation

## 2022-02-08 DIAGNOSIS — Z79899 Other long term (current) drug therapy: Secondary | ICD-10-CM | POA: Insufficient documentation

## 2022-02-08 DIAGNOSIS — R270 Ataxia, unspecified: Secondary | ICD-10-CM | POA: Insufficient documentation

## 2022-02-08 DIAGNOSIS — R519 Headache, unspecified: Secondary | ICD-10-CM | POA: Diagnosis not present

## 2022-02-08 DIAGNOSIS — R202 Paresthesia of skin: Secondary | ICD-10-CM | POA: Diagnosis present

## 2022-02-08 LAB — CBC
HCT: 44.5 % (ref 39.0–52.0)
Hemoglobin: 14.8 g/dL (ref 13.0–17.0)
MCH: 25.3 pg — ABNORMAL LOW (ref 26.0–34.0)
MCHC: 33.3 g/dL (ref 30.0–36.0)
MCV: 76.1 fL — ABNORMAL LOW (ref 80.0–100.0)
Platelets: 250 10*3/uL (ref 150–400)
RBC: 5.85 MIL/uL — ABNORMAL HIGH (ref 4.22–5.81)
RDW: 14.9 % (ref 11.5–15.5)
WBC: 4.6 10*3/uL (ref 4.0–10.5)
nRBC: 0 % (ref 0.0–0.2)

## 2022-02-08 LAB — DIFFERENTIAL
Abs Immature Granulocytes: 0.01 10*3/uL (ref 0.00–0.07)
Basophils Absolute: 0.1 10*3/uL (ref 0.0–0.1)
Basophils Relative: 2 %
Eosinophils Absolute: 0.2 10*3/uL (ref 0.0–0.5)
Eosinophils Relative: 5 %
Immature Granulocytes: 0 %
Lymphocytes Relative: 37 %
Lymphs Abs: 1.7 10*3/uL (ref 0.7–4.0)
Monocytes Absolute: 0.6 10*3/uL (ref 0.1–1.0)
Monocytes Relative: 12 %
Neutro Abs: 2 10*3/uL (ref 1.7–7.7)
Neutrophils Relative %: 44 %

## 2022-02-08 LAB — URINALYSIS, ROUTINE W REFLEX MICROSCOPIC
Bilirubin Urine: NEGATIVE
Glucose, UA: NEGATIVE mg/dL
Hgb urine dipstick: NEGATIVE
Ketones, ur: NEGATIVE mg/dL
Leukocytes,Ua: NEGATIVE
Nitrite: NEGATIVE
Protein, ur: NEGATIVE mg/dL
Specific Gravity, Urine: 1.009 (ref 1.005–1.030)
pH: 7.5 (ref 5.0–8.0)

## 2022-02-08 LAB — RAPID URINE DRUG SCREEN, HOSP PERFORMED
Amphetamines: NOT DETECTED
Barbiturates: NOT DETECTED
Benzodiazepines: NOT DETECTED
Cocaine: NOT DETECTED
Opiates: NOT DETECTED
Tetrahydrocannabinol: NOT DETECTED

## 2022-02-08 LAB — COMPREHENSIVE METABOLIC PANEL
ALT: 103 U/L — ABNORMAL HIGH (ref 0–44)
AST: 63 U/L — ABNORMAL HIGH (ref 15–41)
Albumin: 4.9 g/dL (ref 3.5–5.0)
Alkaline Phosphatase: 48 U/L (ref 38–126)
Anion gap: 12 (ref 5–15)
BUN: 13 mg/dL (ref 6–20)
CO2: 28 mmol/L (ref 22–32)
Calcium: 9.6 mg/dL (ref 8.9–10.3)
Chloride: 100 mmol/L (ref 98–111)
Creatinine, Ser: 1.05 mg/dL (ref 0.61–1.24)
GFR, Estimated: >60 mL/min (ref >60)
Glucose, Bld: 103 mg/dL — ABNORMAL HIGH (ref 70–99)
Potassium: 3.5 mmol/L (ref 3.5–5.1)
Sodium: 140 mmol/L (ref 135–145)
Total Bilirubin: 0.7 mg/dL (ref 0.3–1.2)
Total Protein: 8.7 g/dL — ABNORMAL HIGH (ref 6.5–8.1)

## 2022-02-08 LAB — CBG MONITORING, ED: Glucose-Capillary: 99 mg/dL (ref 70–99)

## 2022-02-08 LAB — PROTIME-INR
INR: 1.1 (ref 0.8–1.2)
Prothrombin Time: 13.7 seconds (ref 11.4–15.2)

## 2022-02-08 LAB — APTT: aPTT: 27 seconds (ref 24–36)

## 2022-02-08 LAB — ETHANOL: Alcohol, Ethyl (B): <10 mg/dL (ref <10)

## 2022-02-08 NOTE — ED Notes (Signed)
Patient given discharge instructions. Questions were answered. Patient verbalized understanding of discharge instructions and care at home.  Discharged with family 

## 2022-02-08 NOTE — ED Provider Notes (Signed)
Patient signed out to me at 0700 by Dr. Christy Gentles pending MRI.  In short this is a 56 year old male with a past medical history of hypertension that presented to the emergency department with left-sided numbness as well as an unsteady gait.  Patient's initial evaluation overnight showed no significant abnormalities within his labs and negative CT head without contrast.  MRI is pending at this time to evaluate for CVA versus TIA.  Upon my evaluation, the patient is awake and alert getting set up to transfer to MRI.   Kemper Durie, DO 02/08/22 [phone]

## 2022-02-08 NOTE — ED Notes (Signed)
Patient transported to MRI 

## 2022-02-08 NOTE — ED Provider Notes (Signed)
[place] EMERGENCY DEPT Provider Note   CSN: [id number] Arrival date & time: 02/08/22  0559     History  Chief Complaint  Patient presents with   Unstable Gait   Numbness    [name] is a 56 y.o. male.  The history is provided by the patient.  Neurologic Problem This is a new problem. The current episode started 12 to 24 hours ago. The problem occurs constantly. The problem has been gradually worsening. Associated symptoms include headaches. Pertinent negatives include no chest pain and no abdominal pain. Nothing aggravates the symptoms. Nothing relieves the symptoms.   Patient with history of hypertension presents with multiple neurologic symptoms Per patient, last known well was "sometime yesterday" He reports he felt "off" all day. He reports he felt dizzy throughout the day.  He woke up around 4 AM this morning noting left arm numbness, left facial numbness, and numbness around his left toes He also reports for about 5 minutes he felt lightheaded and had double vision that has resolved Wife also reports that when he walked he appeared to be unsteady which is new for him. No chest pain or shortness of breath.  He reports mild headache.  No recent trauma He is med compliant with his Norvasc.  However his PCP has  moved on and he does not have one currently    Home Medications Prior to Admission medications   Medication Sig Start Date End Date Taking? Authorizing Provider  amLODipine (NORVASC) 10 MG tablet Take 10 mg by mouth every morning.     [provider]  latanoprost (XALATAN) 0.005 % ophthalmic solution Place 1 drop into both eyes at bedtime.    [provider]  Multiple Vitamin (MULTIVITAMIN WITH MINERALS) TABS tablet Take 1 tablet by mouth every morning.    [provider]      Allergies    Patient has no known allergies.    Review of Systems   Review of Systems  Constitutional:  Negative for fever.   Eyes:  Positive for visual disturbance.  Cardiovascular:  Negative for chest pain.  Gastrointestinal:  Negative for abdominal pain.  Neurological:  Positive for numbness and headaches. Negative for speech difficulty.  Psychiatric/Behavioral:  Negative for confusion.     Physical Exam Updated Vital Signs BP (!) 151/111   Pulse 80   Temp 98.4 F (36.9 C) (Oral)   Resp (!) 23   Ht 1.6 m (5\' 3" )   Wt 65.8 kg   SpO2 100%   BMI 25.69 kg/m  Physical Exam CONSTITUTIONAL: Well developed/well nourished HEAD: Normocephalic/atraumatic EYES: EOMI/PERRL, no visual field deficit  no ptosis, no significant nystagmus ENMT: Mucous membranes moist NECK: supple no meningeal signs CV: S1/S2 noted, no murmurs/rubs/gallops noted LUNGS: Lungs are clear to auscultation bilaterally, no apparent distress ABDOMEN: soft, nontender, no rebound or guarding GU:no cva tenderness NEURO:Awake/alert, appropriate, face symmetric, no arm or leg drift is noted He reports numbness to his left face He reports numbness in his left fingers Reports numbness in his left toes. No other sensory deficit is noted No dysarthria/aphasia EXTREMITIES: pulses normalx4, full ROM SKIN: warm, color normal PSYCH: no abnormalities of mood noted  ED Results / Procedures / Treatments   Labs (all labs ordered are listed, but only abnormal results are displayed) Labs Reviewed  CBC - Abnormal; Notable for the following components:      Result Value   RBC 5.85 (*)    MCV 76.1 (*)    [facility]  25.3 (*)    All other components within normal limits  COMPREHENSIVE METABOLIC PANEL - Abnormal; Notable for the following components:   Glucose, Bld 103 (*)    Total Protein 8.7 (*)    AST 63 (*)    ALT 103 (*)    All other components within normal limits  DIFFERENTIAL  ETHANOL  RAPID URINE DRUG SCREEN, HOSP PERFORMED  URINALYSIS, ROUTINE W REFLEX MICROSCOPIC  PROTIME-INR  APTT  CBG MONITORING, ED    EKG EKG  Interpretation  Date/Time:  Friday February 08 2022 06:08:16 EDT Ventricular Rate:  79 PR Interval:  126 QRS Duration: 102 QT Interval:  375 QTC Calculation: 430 R Axis:   75 Text Interpretation: Sinus rhythm Borderline T abnormalities, diffuse leads No significant change since last tracing Confirmed by Zadie Rhine ([id number]) on 02/08/2022 6:22:09 AM  Radiology CT HEAD WO CONTRAST  Result Date: 02/08/2022 CLINICAL DATA:  Generalized weakness.  Left arm numbness. EXAM: CT HEAD WITHOUT CONTRAST TECHNIQUE: Contiguous axial images were obtained from the base of the skull through the vertex without intravenous contrast. RADIATION DOSE REDUCTION: This exam was performed according to the departmental dose-optimization program which includes automated exposure control, adjustment of the mA and/or kV according to patient size and/or use of iterative reconstruction technique. COMPARISON:  Brain MRI 10/24/2007. FINDINGS: Brain: There is no evidence for acute hemorrhage, hydrocephalus, mass lesion, or abnormal extra-axial fluid collection. No definite CT evidence for acute infarction. Vascular: No hyperdense vessel or unexpected calcification. Skull: No evidence for fracture. No worrisome lytic or sclerotic lesion. Sinuses/Orbits: Opacification of the right frontal sinus and anterior ethmoid air cells evident. Visualized upper right maxillary sinus demonstrates mucosal thickening. Mastoid air cells are clear bilaterally. Visualized portions of the globes and intraorbital fat are unremarkable. Other: None. IMPRESSION: 1. No acute intracranial abnormality. 2. Chronic right frontal, anterior ethmoid, and maxillary sinusitis. This pattern can be seen in the setting of ostiomeatal complex disease. Paranasal sinus CT could be used to further evaluate as clinically warranted. Electronically Signed   By: Kennith Center M.D.   On: 02/08/2022 06:39    Procedures Procedures    Medications Ordered in ED Medications - No  data to display  ED Course/ Medical Decision Making/ A&P Clinical Course as of 02/08/22 0657  [facility] Feb 08, 2022  6270 Ct head negative.  Pt without any new symptoms He is able to ambulate short distance in the room.  However due to recent symptoms (numbness, report diplopia, ataxia) will proceed with MRI brain. [DW]  [phone] Signed out to dr Theresia Lo at shift change to f/u on MRI [DW]    Clinical Course User Index [DW] Zadie Rhine, MD                 NIH Stroke Scale: 1          Medical Decision Making Amount and/or Complexity of Data Reviewed Labs: ordered. Radiology: ordered.   This patient presents to the ED for concern of numbness and ataxia, this involves an extensive number of treatment options, and is a complaint that carries with it a high risk of complications and morbidity.  The differential diagnosis includes but is not limited to CVA, intracranial hemorrhage, acute coronary syndrome, renal failure, urinary tract infection, electrolyte disturbance    Comorbidities that complicate the patient evaluation: Patient's presentation is complicated by their history of pretension  Social Determinants of Health: Patient's impaired access to primary care  increases the complexity of managing their presentation  Additional  history obtained: Additional history obtained from spouse Records reviewed previous admission documents  Lab Tests: I Ordered, and personally interpreted labs.  The pertinent results include:  labs unremarkable  Imaging Studies ordered: I ordered imaging studies including CT scan head   I independently visualized and interpreted imaging which showed no acute findings I agree with the radiologist interpretation  Cardiac Monitoring: The patient was maintained on a cardiac monitor.  I personally viewed and interpreted the cardiac monitor which showed an underlying rhythm of:  sinus rhythm   Reevaluation: After the interventions noted above, I reevaluated  the patient and found that they have :stayed the same  Complexity of problems addressed: Patient's presentation is most consistent with  acute presentation with potential threat to life or bodily function         Final Clinical Impression(s) / ED Diagnoses Final diagnoses:  Paresthesia  Ataxia    Rx / DC Orders ED Discharge Orders     None         Zadie Rhine, MD 02/08/22 [phone]

## 2022-02-08 NOTE — Discharge Instructions (Addendum)
You were seen in the emergency department for your numbness and tingling and your difficulty with walking.  Your work-up showed no obvious signs of stroke and no signs of electrolyte abnormality, dehydration or anemia that could also contribute to your symptoms.  It is unclear what is causing your symptoms at this time, but you should follow-up with your primary doctor in the next few days to have your symptoms rechecked.  You should return to the emergency department if you are having worsening numbness, you have weakness with the numbness, you have difficulty talking, you have difficulty walking, you pass out, or if you have any other new or concerning symptoms.

## 2022-02-08 NOTE — ED Triage Notes (Signed)
Pt sts generalized weakness last night. Noticed some numbness and tingling in left hand ring finger and pinky.   Woke up around 4am with increased left arm numbness, "feeling wobbly" on feet, left facial numbness and tearing of the left eye.   Admits a hx of bells palsy but never had these other sx.

## 2022-02-08 NOTE — ED Notes (Signed)
ED Provider at bedside. 

## 2022-12-02 ENCOUNTER — Other Ambulatory Visit: Payer: Self-pay | Admitting: Urology

## 2022-12-16 NOTE — Progress Notes (Signed)
COVID Vaccine Completed:  Date of COVID positive in last 90 days:  PCP - Beverley Fiedler, MD Cardiologist -   Chest x-ray -  EKG - 02/11/22 Epic Stress Test -  ECHO -  Cardiac Cath -  Pacemaker/ICD device last checked: Spinal Cord Stimulator:  Bowel Prep -   Sleep Study -  CPAP -   Fasting Blood Sugar -  Checks Blood Sugar _____ times a day  Last dose of GLP1 agonist-  N/A GLP1 instructions:  N/A   Last dose of SGLT-2 inhibitors-  N/A SGLT-2 instructions: N/A   Blood Thinner Instructions:  Time Aspirin Instructions: Last Dose:  Activity level:  Can go up a flight of stairs and perform activities of daily living without stopping and without symptoms of chest pain or shortness of breath.  Able to exercise without symptoms  Unable to go up a flight of stairs without symptoms of     Anesthesia review:   Patient denies shortness of breath, fever, cough and chest pain at PAT appointment  Patient verbalized understanding of instructions that were given to them at the PAT appointment. Patient was also instructed that they will need to review over the PAT instructions again at home before surgery.

## 2022-12-16 NOTE — Patient Instructions (Signed)
SURGICAL WAITING ROOM VISITATION  Patients having surgery or a procedure may have no more than 2 support people in the waiting area - these visitors may rotate.    Children under the age of 12 must have an adult with them who is not the patient.  Due to an increase in RSV and influenza rates and associated hospitalizations, children ages 42 and under may not visit patients in [facility] hospitals.  If the patient needs to stay at the hospital during part of their recovery, the visitor guidelines for inpatient rooms apply. Pre-op nurse will coordinate an appropriate time for 1 support person to accompany patient in pre-op.  This support person may not rotate.    Please refer to the [facility] website for the visitor guidelines for Inpatients (after your surgery is over and you are in a regular room).    Your procedure is scheduled on: 01/01/23   Report to [facility] Main Entrance    Report to admitting at 9:30 AM   Call this number if you have problems the morning of surgery [phone]   Do not eat food or drink liquids :After Midnight.          If you have questions, please contact your surgeon's office.   FOLLOW BOWEL PREP AND ANY ADDITIONAL PRE OP INSTRUCTIONS YOU RECEIVED FROM YOUR SURGEON'S OFFICE!!!     Oral Hygiene is also important to reduce your risk of infection.                                    Remember - BRUSH YOUR TEETH THE MORNING OF SURGERY WITH YOUR REGULAR TOOTHPASTE  DENTURES WILL BE REMOVED PRIOR TO SURGERY PLEASE DO NOT APPLY "Poly grip" OR ADHESIVES!!!   Stop all vitamins and herbal supplements 7 days before surgery.   Take these medicines the morning of surgery with A SIP OF WATER: Amlodipine                               You may not have any metal on your body including jewelry, and body piercing             Do not wear lotions, powders, cologne, or deodorant              Men may shave face and neck.   Do not bring valuables to the  hospital. [facility] IS NOT             RESPONSIBLE   FOR VALUABLES.   Contacts, glasses, dentures or bridgework may not be worn into surgery.  DO NOT BRING YOUR HOME MEDICATIONS TO THE HOSPITAL. PHARMACY WILL DISPENSE MEDICATIONS LISTED ON YOUR MEDICATION LIST TO YOU DURING YOUR ADMISSION IN THE HOSPITAL!    Patients discharged on the day of surgery will not be allowed to drive home.  Someone NEEDS to stay with you for the first 24 hours after anesthesia.   Special Instructions: Bring a copy of your healthcare power of attorney and living will documents the day of surgery if you haven't scanned them before.              Please read over the following fact sheets you were given: IF YOU HAVE QUESTIONS ABOUT YOUR PRE-OP INSTRUCTIONS PLEASE CALL [phone] [facility]ras   If you received a COVID test during your pre-op visit  it  is requested that you wear a mask when out in public, stay away from anyone that may not be feeling well and notify your surgeon if you develop symptoms. If you test positive for Covid or have been in contact with anyone that has tested positive in the last 10 days please notify you surgeon.    [facility] - Preparing for Surgery Before surgery, you can play an important role.  Because skin is not sterile, your skin needs to be as free of germs as possible.  You can reduce the number of germs on your skin by washing with CHG (chlorahexidine gluconate) soap before surgery.  CHG is an antiseptic cleaner which kills germs and bonds with the skin to continue killing germs even after washing. Please DO NOT use if you have an allergy to CHG or antibacterial soaps.  If your skin becomes reddened/irritated stop using the CHG and inform your nurse when you arrive at Short Stay. Do not shave (including legs and underarms) for at least 48 hours prior to the first CHG shower.  You may shave your face/neck.  Please follow these instructions carefully:  1.  Shower with CHG Soap the night  before surgery and the  morning of surgery.  2.  If you choose to wash your hair, wash your hair first as usual with your normal  shampoo.  3.  After you shampoo, rinse your hair and body thoroughly to remove the shampoo.                             4.  Use CHG as you would any other liquid soap.  You can apply chg directly to the skin and wash.  Gently with a scrungie or clean washcloth.  5.  Apply the CHG Soap to your body ONLY FROM THE NECK DOWN.   Do   not use on face/ open                           Wound or open sores. Avoid contact with eyes, ears mouth and   genitals (private parts).                       Wash face,  Genitals (private parts) with your normal soap.             6.  Wash thoroughly, paying special attention to the area where your    surgery  will be performed.  7.  Thoroughly rinse your body with warm water from the neck down.  8.  DO NOT shower/wash with your normal soap after using and rinsing off the CHG Soap.                9.  Pat yourself dry with a clean towel.            10.  Wear clean pajamas.            11.  Place clean sheets on your bed the night of your first shower and do not  sleep with pets. Day of Surgery : Do not apply any lotions/deodorants the morning of surgery.  Please wear clean clothes to the hospital/surgery center.  FAILURE TO FOLLOW THESE INSTRUCTIONS MAY RESULT IN THE CANCELLATION OF YOUR SURGERY  PATIENT SIGNATURE_________________________________  NURSE SIGNATURE__________________________________  ________________________________________________________________________

## 2022-12-17 ENCOUNTER — Encounter (HOSPITAL_COMMUNITY)
Admission: RE | Admit: 2022-12-17 | Discharge: 2022-12-17 | Disposition: A | Discharge status: Home / Self Care | Payer: BLUE CROSS/BLUE SHIELD | Source: Ambulatory Visit | Attending: Urology | Admitting: Urology

## 2022-12-17 ENCOUNTER — Other Ambulatory Visit: Payer: Self-pay

## 2022-12-17 ENCOUNTER — Encounter (HOSPITAL_COMMUNITY): Payer: Self-pay

## 2022-12-17 VITALS — BP 145/105 | HR 95 | Temp 98.6°F | Resp 16 | Ht 64.0 in | Wt 152.0 lb

## 2022-12-17 DIAGNOSIS — I10 Essential (primary) hypertension: Secondary | ICD-10-CM | POA: Diagnosis not present

## 2022-12-17 DIAGNOSIS — Z01818 Encounter for other preprocedural examination: Secondary | ICD-10-CM | POA: Diagnosis present

## 2022-12-17 DIAGNOSIS — Z01812 Encounter for preprocedural laboratory examination: Secondary | ICD-10-CM | POA: Insufficient documentation

## 2022-12-17 HISTORY — DX: Cardiac arrhythmia, unspecified: I49.9

## 2022-12-17 LAB — BASIC METABOLIC PANEL
Anion gap: 11 (ref 5–15)
BUN: 12 mg/dL (ref 6–20)
CO2: 23 mmol/L (ref 22–32)
Calcium: 8.8 mg/dL — ABNORMAL LOW (ref 8.9–10.3)
Chloride: 104 mmol/L (ref 98–111)
Creatinine, Ser: 0.82 mg/dL (ref 0.61–1.24)
GFR, Estimated: >60 mL/min (ref >60)
Glucose, Bld: 110 mg/dL — ABNORMAL HIGH (ref 70–99)
Potassium: 3.5 mmol/L (ref 3.5–5.1)
Sodium: 138 mmol/L (ref 135–145)

## 2022-12-17 LAB — CBC
HCT: 42.7 % (ref 39.0–52.0)
Hemoglobin: 13.9 g/dL (ref 13.0–17.0)
MCH: 24.4 pg — ABNORMAL LOW (ref 26.0–34.0)
MCHC: 32.6 g/dL (ref 30.0–36.0)
MCV: 74.9 fL — ABNORMAL LOW (ref 80.0–100.0)
Platelets: 265 10*3/uL (ref 150–400)
RBC: 5.7 MIL/uL (ref 4.22–5.81)
RDW: 15.8 % — ABNORMAL HIGH (ref 11.5–15.5)
WBC: 5.2 10*3/uL (ref 4.0–10.5)
nRBC: 0 % (ref 0.0–0.2)

## 2022-12-26 NOTE — Progress Notes (Signed)
Choose an anesthesia record to view details        DISCUSSION: [name] is a 57 yo male who presents to PAT prior to cystoscopy, uteretoscopy, and stent placement on 01/01/23 with Dr. Berneice Heinrich. PMH significant for HTN, hx of kidney stones, transaminitis  No prior anesthesia complications  Patient follows with his PCP for chronic medical conditions. He was seen on 11/15/22 for palpitations which were described as transient. Labs were obtained which showed mild transaminitis but were otherwise unremarkable. 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 Kidney function noted to be normal on PAT screening labs. He was referred to Cardiology.  He saw Cardiology on 11/16/22. Echo and heart monitoring were orders which were overall reassuring. He was advised to f/u in 3 months. Metoprolol was stopped.  VS: BP (!) 145/105   Pulse 95   Temp 37 C (Oral)   Resp 16   Ht 5\' 4"  (1.626 m)   Wt 68.9 kg   SpO2 99%   BMI 26.09 kg/m   PROVIDERS: Eliezer Lofts, MD [facility] Medical) Cardiology: Rosita Kea, MD [facility] Medical)   LABS: Labs reviewed: Acceptable for surgery. (all labs ordered are listed, but only abnormal results are displayed)  Labs Reviewed  BASIC METABOLIC PANEL - Abnormal; Notable for the following components:      Result Value   Glucose, Bld 110 (*)    Calcium 8.8 (*)    All other components within normal limits  CBC - Abnormal; Notable for the following components:   MCV 74.9 (*)    MCH 24.4 (*)    RDW 15.8 (*)    All other components within normal limits     IMAGES:   EKG:   CV:  Echo 12/06/22 (paper copy in chart)  Findings EKG rhythm: Sinus rhythm Study quality: This was a technically good study Left ventricle: LV cavity size, wall thickness and systolic function are normal.  Overall left ventricular systolic function is normal; LVEF 60 to 65%.  The diastolic filling pattern is normal for the age of the  patient.  No focal or regional wall motion abnormalities Left atrium: The left atrial size is normal; LA volume 26 mL/meters squared Right ventricle: The right ventricle is normal in size and function Right atrium: The right atrium is normal in size and function Septum: Hypermobile anterior atrial septum (translation of greater than 10 mm beyond plane).  No Doppler evidence of shunt Aortic valve: The aortic valve appears structurally normal and trileaflet.  No aortic stenosis or regurgitation Mitral valve: Normal-appearing mitral valve with normal valve function. Tricuspid valve: The tricuspid valve was not well-visualized.  The tricuspid valve appears structurally normal with normal function.  Insufficient TR to accurately determine RV systolic pressures; findings do not indicated pulmonary HTN No evidence of hemodynamically significant valvular abnormality  Ambulatory cardiac monitoring 11/16/22  Symptomatic unifocal PVC less than 0.1% Predominant rhythm normal sinus rhythm PAC less than 0.1%  Past Medical History:  Diagnosis Date   At risk for sleep apnea    STOP-BANG= 4    SENT TO PCP  11-23-2014   Dysrhythmia    PVCs   Glaucoma    History of kidney stones    Hypertension    Renal calculus, right    Urgency of urination    Wears glasses     Past Surgical History:  Procedure Laterality Date   CYSTOSCOPY W/ RETROGRADES Right 11/25/2014   Procedure: CYSTOSCOPY WITH RETROGRADE  PYELOGRAM;  Surgeon: Sebastian Ache, MD;  [facility];  Service: Urology;  Laterality: Right;   CYSTOSCOPY WITH RETROGRADE PYELOGRAM, URETEROSCOPY AND STENT PLACEMENT Right 11/02/2014   Procedure: CYSTOSCOPY WITH RIGHT  RETROGRADE PYELOGRAM, diagnostic URETEROSCOPY AND STENT PLACEMENT;  Surgeon: Sebastian Ache, MD;  Location: [facility];  Service: Urology;  Laterality: Right;   CYSTOSCOPY WITH URETEROSCOPY AND STENT PLACEMENT Right 11/25/2014   Procedure: CYSTOSCOPY WITH RIGHT URETEROSCOPY  AND STENT REPLACEMENT;  Surgeon: Sebastian Ache, MD;  Location: [facility];  Service: Urology;  Laterality: Right;   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   EXTRACORPOREAL SHOCK WAVE LITHOTRIPSY  2005  &  07-10-2009   HOLMIUM LASER APPLICATION Right 11/25/2014   Procedure: HOLMIUM LASER APPLICATION;  Surgeon: Sebastian Ache, MD;  Location: [facility];  Service: Urology;  Laterality: Right;   STONE EXTRACTION WITH BASKET  11/25/2014   Procedure: STONE EXTRACTION WITH BASKET;  Surgeon: Sebastian Ache, MD;  Location: [facility];  Service: Urology;;    MEDICATIONS:  amLODipine (NORVASC) 10 MG tablet   azelastine (ASTELIN) 0.1 % nasal spray   No current facility-administered medications for this encounter.   Marcille Blanco [facility] Surgical Short Stay/Anesthesiology [facility] Phone [phone] 12/26/2022 9:58 AM

## 2022-12-26 NOTE — Anesthesia Preprocedure Evaluation (Addendum)
Anesthesia Evaluation  Patient identified by MRN, date of birth, ID band Patient awake    Reviewed: Allergy & Precautions, NPO status , Patient's Chart, lab work & pertinent test results  Airway Mallampati: II  TM Distance: >3 FB Neck ROM: Full    Dental no notable dental hx. (+) Teeth Intact, Dental Advisory Given   Pulmonary neg pulmonary ROS, former smoker   Pulmonary exam normal breath sounds clear to auscultation       Cardiovascular Exercise Tolerance: Good hypertension, Pt. on medications Normal cardiovascular exam Rhythm:Regular Rate:Normal     Neuro/Psych negative neurological ROS  negative psych ROS   GI/Hepatic negative GI ROS, Neg liver ROS,,,  Endo/Other  negative endocrine ROS    Renal/GU Renal disease  negative genitourinary   Musculoskeletal negative musculoskeletal ROS (+)    Abdominal   Peds negative pediatric ROS (+)  Hematology negative hematology ROS (+)   Anesthesia Other Findings Invisaline retainer in place  Reproductive/Obstetrics negative OB ROS                             Anesthesia Physical Anesthesia Plan  ASA: II  Anesthesia Plan: General   Post-op Pain Management:    Induction: Intravenous  PONV Risk Score and Plan: 2 and Ondansetron, Midazolam and Treatment may vary due to age or medical condition  Airway Management Planned: LMA  Additional Equipment:   Intra-op Plan:   Post-operative Plan: Extubation in OR  Informed Consent: I have reviewed the patients History and Physical, chart, labs and discussed the procedure including the risks, benefits and alternatives for the proposed anesthesia with the patient or authorized representative who has indicated his/her understanding and acceptance.     Dental advisory given  Plan Discussed with: CRNA and Surgeon  Anesthesia Plan Comments: (See PAT note from 8/13 by K Gekas PA-C )         Anesthesia Quick Evaluation

## 2023-01-01 ENCOUNTER — Ambulatory Visit (HOSPITAL_COMMUNITY): Payer: BLUE CROSS/BLUE SHIELD | Admitting: Medical

## 2023-01-01 ENCOUNTER — Encounter (HOSPITAL_COMMUNITY): Payer: Self-pay | Admitting: Urology

## 2023-01-01 ENCOUNTER — Encounter (HOSPITAL_COMMUNITY)
Admission: RE | Disposition: A | Discharge status: Home / Self Care | Payer: Self-pay | Source: Ambulatory Visit | Attending: Urology

## 2023-01-01 ENCOUNTER — Ambulatory Visit (HOSPITAL_COMMUNITY): Payer: BLUE CROSS/BLUE SHIELD

## 2023-01-01 ENCOUNTER — Ambulatory Visit (HOSPITAL_COMMUNITY)
Admission: RE | Admit: 2023-01-01 | Discharge: 2023-01-01 | Disposition: A | Discharge status: Home / Self Care | Payer: BLUE CROSS/BLUE SHIELD | Source: Ambulatory Visit | Attending: Urology | Admitting: Urology

## 2023-01-01 DIAGNOSIS — N202 Calculus of kidney with calculus of ureter: Secondary | ICD-10-CM | POA: Insufficient documentation

## 2023-01-01 DIAGNOSIS — Z87442 Personal history of urinary calculi: Secondary | ICD-10-CM | POA: Insufficient documentation

## 2023-01-01 DIAGNOSIS — I10 Essential (primary) hypertension: Secondary | ICD-10-CM | POA: Insufficient documentation

## 2023-01-01 DIAGNOSIS — Z87891 Personal history of nicotine dependence: Secondary | ICD-10-CM | POA: Insufficient documentation

## 2023-01-01 HISTORY — PX: HOLMIUM LASER APPLICATION: SHX5852

## 2023-01-01 HISTORY — PX: CYSTOSCOPY WITH RETROGRADE PYELOGRAM, URETEROSCOPY AND STENT PLACEMENT: SHX5789

## 2023-01-01 SURGERY — CYSTOURETEROSCOPY, WITH RETROGRADE PYELOGRAM AND STENT INSERTION
Anesthesia: General | Laterality: Left

## 2023-01-01 MED ORDER — SODIUM CHLORIDE 0.9 % IR SOLN
Status: DC | PRN
  Administered 2023-01-01: 3000 mL via INTRAVESICAL

## 2023-01-01 MED ORDER — LIDOCAINE 2% (20 MG/ML) 5 ML SYRINGE
INTRAMUSCULAR | Status: DC | PRN
  Administered 2023-01-01: 100 mg via INTRAVENOUS

## 2023-01-01 MED ORDER — DEXAMETHASONE SODIUM PHOSPHATE 10 MG/ML IJ SOLN
INTRAMUSCULAR | Status: DC | PRN
  Administered 2023-01-01: 5 mg via INTRAVENOUS

## 2023-01-01 MED ORDER — ONDANSETRON HCL 4 MG/2ML IJ SOLN
INTRAMUSCULAR | Status: AC
  Filled 2023-01-01: qty 2

## 2023-01-01 MED ORDER — ORAL CARE MOUTH RINSE: 15.0000 mL | Freq: Once | OROMUCOSAL | Status: AC

## 2023-01-01 MED ORDER — PHENYLEPHRINE HCL (PRESSORS) 10 MG/ML IV SOLN
INTRAVENOUS | Status: AC
  Filled 2023-01-01: qty 1

## 2023-01-01 MED ORDER — LIDOCAINE HCL (PF) 2 % IJ SOLN
INTRAMUSCULAR | Status: AC
  Filled 2023-01-01: qty 5

## 2023-01-01 MED ORDER — OXYCODONE-ACETAMINOPHEN 5-325 MG PO TABS: 1.0000 | ORAL_TABLET | Freq: Four times a day (QID) | ORAL | 0 refills | Status: AC | PRN

## 2023-01-01 MED ORDER — PROPOFOL 10 MG/ML IV BOLUS
INTRAVENOUS | Status: AC
  Filled 2023-01-01: qty 20

## 2023-01-01 MED ORDER — IOHEXOL 300 MG/ML  SOLN
INTRAMUSCULAR | Status: DC | PRN
  Administered 2023-01-01: 17 mL via URETHRAL

## 2023-01-01 MED ORDER — KETOROLAC TROMETHAMINE 10 MG PO TABS: 10.0000 mg | ORAL_TABLET | Freq: Three times a day (TID) | ORAL | 0 refills | Status: AC | PRN

## 2023-01-01 MED ORDER — DEXAMETHASONE SODIUM PHOSPHATE 10 MG/ML IJ SOLN
INTRAMUSCULAR | Status: AC
  Filled 2023-01-01: qty 1

## 2023-01-01 MED ORDER — CEPHALEXIN 500 MG PO CAPS: 500.0000 mg | ORAL_CAPSULE | Freq: Two times a day (BID) | ORAL | 0 refills | Status: AC

## 2023-01-01 MED ORDER — CEFAZOLIN SODIUM-DEXTROSE 2-4 GM/100ML-% IV SOLN
2.0000 g | INTRAVENOUS | Status: AC
  Administered 2023-01-01: 2 g via INTRAVENOUS
  Filled 2023-01-01: qty 100

## 2023-01-01 MED ORDER — LACTATED RINGERS IV SOLN: INTRAVENOUS | Status: DC

## 2023-01-01 MED ORDER — PHENYLEPHRINE 80 MCG/ML (10ML) SYRINGE FOR IV PUSH (FOR BLOOD PRESSURE SUPPORT)
PREFILLED_SYRINGE | INTRAVENOUS | Status: AC
  Filled 2023-01-01: qty 10

## 2023-01-01 MED ORDER — SENNOSIDES-DOCUSATE SODIUM 8.6-50 MG PO TABS: 1.0000 | ORAL_TABLET | Freq: Two times a day (BID) | ORAL | 0 refills | Status: AC

## 2023-01-01 MED ORDER — AMISULPRIDE (ANTIEMETIC) 5 MG/2ML IV SOLN: 10.0000 mg | Freq: Once | INTRAVENOUS | Status: DC | PRN

## 2023-01-01 MED ORDER — PROMETHAZINE HCL 25 MG/ML IJ SOLN: 6.2500 mg | INTRAMUSCULAR | Status: DC | PRN

## 2023-01-01 MED ORDER — MIDAZOLAM HCL 2 MG/2ML IJ SOLN
INTRAMUSCULAR | Status: AC
  Filled 2023-01-01: qty 2

## 2023-01-01 MED ORDER — PROPOFOL 10 MG/ML IV BOLUS
INTRAVENOUS | Status: DC | PRN
  Administered 2023-01-01: 200 mg via INTRAVENOUS
  Administered 2023-01-01: 50 mg via INTRAVENOUS

## 2023-01-01 MED ORDER — PHENYLEPHRINE 80 MCG/ML (10ML) SYRINGE FOR IV PUSH (FOR BLOOD PRESSURE SUPPORT)
PREFILLED_SYRINGE | INTRAVENOUS | Status: DC | PRN
  Administered 2023-01-01 (×3): 160 ug via INTRAVENOUS

## 2023-01-01 MED ORDER — MIDAZOLAM HCL 5 MG/5ML IJ SOLN
INTRAMUSCULAR | Status: DC | PRN
  Administered 2023-01-01: 2 mg via INTRAVENOUS

## 2023-01-01 MED ORDER — CHLORHEXIDINE GLUCONATE 0.12 % MT SOLN
15.0000 mL | Freq: Once | OROMUCOSAL | Status: AC
  Administered 2023-01-01: 15 mL via OROMUCOSAL

## 2023-01-01 MED ORDER — FENTANYL CITRATE (PF) 100 MCG/2ML IJ SOLN
INTRAMUSCULAR | Status: DC | PRN
  Administered 2023-01-01: 50 ug via INTRAVENOUS

## 2023-01-01 MED ORDER — OXYCODONE HCL 5 MG/5ML PO SOLN: 5.0000 mg | Freq: Once | ORAL | Status: DC | PRN

## 2023-01-01 MED ORDER — HYDROMORPHONE HCL 1 MG/ML IJ SOLN: 0.2500 mg | INTRAMUSCULAR | Status: DC | PRN

## 2023-01-01 MED ORDER — OXYCODONE HCL 5 MG PO TABS: 5.0000 mg | ORAL_TABLET | Freq: Once | ORAL | Status: DC | PRN

## 2023-01-01 MED ORDER — FENTANYL CITRATE (PF) 100 MCG/2ML IJ SOLN
INTRAMUSCULAR | Status: AC
  Filled 2023-01-01: qty 2

## 2023-01-01 SURGICAL SUPPLY — 25 items
BAG URO CATCHER STRL LF (MISCELLANEOUS) ×1 IMPLANT
BASKET LASER NITINOL 1.9FR (BASKET) IMPLANT
BSKT STON RTRVL 120 1.9FR (BASKET) ×1
CATH URETL OPEN END 6FR 70 (CATHETERS) ×1 IMPLANT
CLOTH BEACON ORANGE TIMEOUT ST (SAFETY) ×1 IMPLANT
EXTRACTOR STONE 1.7FRX115CM (UROLOGICAL SUPPLIES) IMPLANT
GLOVE SURG LX STRL 7.5 STRW (GLOVE) ×1 IMPLANT
GOWN STRL REUS W/ TWL XL LVL3 (GOWN DISPOSABLE) ×1 IMPLANT
GOWN STRL REUS W/TWL XL LVL3 (GOWN DISPOSABLE) ×3
GUIDEWIRE ANG ZIPWIRE 038X150 (WIRE) ×1 IMPLANT
GUIDEWIRE STR DUAL SENSOR (WIRE) ×1 IMPLANT
KIT TURNOVER KIT A (KITS) IMPLANT
LASER FIB FLEXIVA PULSE ID 365 (Laser) IMPLANT
LASER FIB FLEXIVA PULSE ID 550 (Laser) IMPLANT
LASER FIB FLEXIVA PULSE ID 910 (Laser) IMPLANT
MANIFOLD NEPTUNE II (INSTRUMENTS) ×1 IMPLANT
PACK CYSTO (CUSTOM PROCEDURE TRAY) ×1 IMPLANT
SHEATH NAVIGATOR HD 11/13X28 (SHEATH) IMPLANT
SHEATH NAVIGATOR HD 11/13X36 (SHEATH) IMPLANT
STENT POLARIS 5FRX24 (STENTS) IMPLANT
TRACTIP FLEXIVA PULS ID 200XHI (Laser) IMPLANT
TRACTIP FLEXIVA PULSE ID 200 (Laser) ×1
TUBE PU 8FR 16IN ENFIT (TUBING) ×1 IMPLANT
TUBING CONNECTING 10 (TUBING) ×1 IMPLANT
TUBING UROLOGY SET (TUBING) ×1 IMPLANT

## 2023-01-01 NOTE — Discharge Instructions (Addendum)
1 - You may have urinary urgency (bladder spasms) and bloody urine on / off with stent in place. This is normal.  2 - Removed tethered stent on Friday morning at home by pulling on string, then blue-white plastic tubing and discarding. Office is open Friday if any problems arise.   3 - Call MD or go to ER for fever >102, severe pain / nausea / vomiting not relieved by medications, or acute change in medical status

## 2023-01-01 NOTE — Brief Op Note (Signed)
01/01/2023  11:43 AM  PATIENT:  [name]  57 y.o. male  PRE-OPERATIVE DIAGNOSIS:  LEFT URETERAL STONE  POST-OPERATIVE DIAGNOSIS:  left ureteral stone  PROCEDURE:  Procedure(s) with comments: CYSTOSCOPY WITH RETROGRADE PYELOGRAM, URETEROSCOPY AND STENT PLACEMENT (Left) - 1 HR HOLMIUM LASER APPLICATION (Left)  SURGEON:  Surgeons and Role:    * Kendale Rembold, Delbert Phenix., MD - Primary  PHYSICIAN ASSISTANT:   ASSISTANTS: none   ANESTHESIA:   general  EBL:  minimal   BLOOD ADMINISTERED:none  DRAINS: none   LOCAL MEDICATIONS USED:  NONE  SPECIMEN:  Source of Specimen:  left ureteral / renal stone fragments  DISPOSITION OF SPECIMEN:   [facility] for compositional analysis  COUNTS:  YES  TOURNIQUET:  * No tourniquets in log *  DICTATION: .Other Dictation: Dictation Number [id number]  PLAN OF CARE: Discharge to home after PACU  PATIENT DISPOSITION:  PACU - hemodynamically stable.   Delay start of Pharmacological VTE agent (>24hrs) due to surgical blood loss or risk of bleeding: yes

## 2023-01-01 NOTE — Anesthesia Postprocedure Evaluation (Signed)
Anesthesia Post Note  Patient: [name]  Procedure(s) Performed: CYSTOSCOPY WITH RETROGRADE PYELOGRAM, URETEROSCOPY AND STENT PLACEMENT (Left) HOLMIUM LASER APPLICATION (Left)     Patient location during evaluation: PACU Anesthesia Type: General Level of consciousness: awake and alert Pain management: pain level controlled Vital Signs Assessment: post-procedure vital signs reviewed and stable Respiratory status: spontaneous breathing, nonlabored ventilation and respiratory function stable Cardiovascular status: blood pressure returned to baseline and stable Postop Assessment: no apparent nausea or vomiting Anesthetic complications: no   No notable events documented.  Last Vitals:  Vitals:   01/01/23 1230 01/01/23 1243  BP: (!) 128/97 (!) 132/96  Pulse: 69 68  Resp: 18 18  Temp: (!) 36.4 C 36.4 C  SpO2: 96% 97%    Last Pain:  Vitals:   01/01/23 1243  TempSrc: Oral  PainSc: 2                  Lowella Curb

## 2023-01-01 NOTE — H&P (Signed)
[name] is an 57 y.o. male.    Chief Complaint: Pre-OP LEFT Ureteroscopic Stone Manipulation  HPI:   1 - Recurrent Nephrolithiasis -  2011 - SWL x2  10/2014 - Rt Ureteroscopy / laser / tethered stent for 4mm prox ureteral and 5mm ower pole. Composition 100% CaOx.   Recent Surveillance:  06/2020 - [facility] 6mm stone, numerous bilateral phleboliths.  07/2021 - [facility] 7mm, Lt mid ureter 5mm and bilateral pap tip cals.  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  2016 - renogram with symmeteric function and only slight left delay, T1/2 17 min   PMH sig for HTN. NO CV disease. 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 Enjoys working out early AM most days at [employer] on [address].   Today " [name] " is seen to proceed with LEFT ureteroscopic stone manipulation for chronic left ureteral stone that has been present for lieklky over a year. No interval fevers. Hgb 13, Most recent UA without infectious parameters.  Past Medical History:  Diagnosis Date   At risk for sleep apnea    STOP-BANG= 4    SENT TO PCP  11-23-2014   Dysrhythmia    PVCs   Glaucoma    History of kidney stones    Hypertension    Renal calculus, right    Urgency of urination    Wears glasses     Past Surgical History:  Procedure Laterality Date   CYSTOSCOPY W/ RETROGRADES Right 11/25/2014   Procedure: CYSTOSCOPY WITH RETROGRADE PYELOGRAM;  Surgeon: Sebastian Ache, MD;  Location: [facility];  Service: Urology;  Laterality: Right;   CYSTOSCOPY WITH RETROGRADE PYELOGRAM, URETEROSCOPY AND STENT PLACEMENT Right 11/02/2014   Procedure: CYSTOSCOPY WITH RIGHT  RETROGRADE PYELOGRAM, diagnostic URETEROSCOPY AND STENT PLACEMENT;  Surgeon: Sebastian Ache, MD;  Location: [facility];  Service: Urology;  Laterality: Right;   CYSTOSCOPY WITH URETEROSCOPY AND STENT PLACEMENT Right 11/25/2014   Procedure: CYSTOSCOPY WITH RIGHT  URETEROSCOPY AND STENT REPLACEMENT;  Surgeon: Sebastian Ache, MD;  Location: [facility];  Service: Urology;  Laterality: Right;   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 Social History:  reports that he quit smoking about 28 years ago. His smoking use included cigarettes. He started smoking about 28 years ago. He has never used smokeless tobacco. He reports current alcohol use of about 3.0 standard drinks of alcohol per week. He reports that he does not use drugs.  Allergies: No Known Allergies  No medications prior to admission.    No results found for this or any previous visit (from the past 48 hour(s)). No results found.  Review of Systems  Constitutional:  Negative for chills.  Genitourinary:  Positive for flank pain.  All other systems reviewed and are negative.   There were no vitals taken for this visit. Physical Exam Vitals reviewed.  HENT:  Head: Normocephalic.  Eyes:     Pupils: Pupils are equal, round, and reactive to light.  Cardiovascular:     Rate and Rhythm: Normal rate.  Pulmonary:     Effort: Pulmonary effort is normal.  Abdominal:     General: Abdomen is flat.  Genitourinary:    Comments: Some mild left CVAT Musculoskeletal:        General: Normal range of motion.     Cervical back: Normal range of motion.  Neurological:     General: No focal deficit present.     Mental Status: He is alert.  Psychiatric:        Mood and Affect: Mood normal.       Assessment/Plan  Proceed as planned with LEFT Ureteroscopic stone manipulaiton. Risks, benefits, alternatives, expected peri-op course discussed previously and reiterated today.   Loletta Parish., MD 01/01/2023, 6:50 AM

## 2023-01-01 NOTE — Transfer of Care (Signed)
Immediate Anesthesia Transfer of Care Note  Patient: [name]  Procedure(s) Performed: CYSTOSCOPY WITH RETROGRADE PYELOGRAM, URETEROSCOPY AND STENT PLACEMENT (Left) HOLMIUM LASER APPLICATION (Left)  Patient Location: PACU  Anesthesia Type:General  Level of Consciousness: drowsy  Airway & Oxygen Therapy: Patient Spontanous Breathing and Patient connected to face mask oxygen  Post-op Assessment: Report given to RN and Post -op Vital signs reviewed and stable  Post vital signs: Reviewed and stable  Last Vitals:  Vitals Value Taken Time  BP 121/97 01/01/23 1152  Temp    Pulse 77 01/01/23 1154  Resp 12 01/01/23 1154  SpO2 100 % 01/01/23 1154  Vitals shown include unfiled device data.  Last Pain:  Vitals:   01/01/23 1004  TempSrc: Oral  PainSc:          Complications: No notable events documented.

## 2023-01-01 NOTE — Op Note (Signed)
NAME: [name], [name] MEDICAL RECORD NO: [id number] ACCOUNT NO: [id number] DATE OF BIRTH: [birth year] FACILITY: [facility] LOCATION: [facility] PHYSICIAN: Sebastian Ache, MD  Operative Report   DATE OF PROCEDURE: 01/01/2023  PREOPERATIVE DIAGNOSES:  Left ureteral and renal stones, recurrent urolithiasis.  POSTOPERATIVE DIAGNOSIS: Left ureteral and renal stones, recurrent urolithiasis.  PROCEDURE PERFORMED:   1.  Cystoscopy with left retrograde pyelogram interpretation. 2.  Left ureteroscopy with laser lithotripsy. 3.  Insertion of left ureteral stent.  ESTIMATED BLOOD LOSS:  Nil.  COMPLICATIONS:  None.  SPECIMEN:  Left ureteral and renal stone for composition analysis.  FINDINGS:   1.  Left mid ureteral stone with moderate hydronephrosis. 2.  Left lower pole renal stone. 3.  Complete resolution of all accessible stone fragments larger than one-third mm following laser lithotripsy and basket extraction. 4.  Successful placement of left ureteral stent, proximal end in the renal pelvis, distal end in urinary bladder, with tether.  INDICATIONS:  The patient is a very pleasant 57 year old man with history of recurrent urolithiasis.  He was found on surveillance imaging and workup of some flank pain to have a left mid ureteral stone and left lower pole stone last year.  He was  scheduled for ureteroscopy; however, his insurance declined and we then failed to followup.  He represented early this year with residual flank pain and found to have persistence of left mid ureteral stone, renal stone, but fortunately preserved renal  parenchyma. I again counseled the patient towards ureteroscopy with goal of left side stone free and he presents for this today.  Informed consent was obtained and placed in medical record.  PROCEDURE DETAILS:  The patient being identified and verified, procedure being left ureteroscopic stone manipulation was confirmed.  Procedure timeout was performed.  Intravenous  antibiotics were administered.  General LMA anesthesia induced.  The  patient was placed into a low lithotomy position.  Sterile field was created, prepped and draped the patient's penis, perineum, and proximal thighs using iodine.  Cystourethroscopy was performed using a 21-French rigid cystoscope.  Inspection of anterior  and posterior was unremarkable.  Inspection of urinary bladder revealed no diverticula, calcifications, papillary lesions.  Ureteral orifices were single.  The left ureteral orifice was cannulated with 6-French end-hole catheter, and a left retrograde  pyelogram was obtained.  Left retrograde pyelogram demonstrated single left ureter, single system left kidney.  There was a filling defect in the mid ureter consistent with stone with moderate hydronephrosis above this.  A 0.038 ZIPwire was advanced lower poles set aside as a  safety wire.  The patient was placed in the urinary bladder for pressure release and semirigid ureteroscopy was performed of the distal left ureter alongside a separate sensor working wire. The upper reaches of the semirigid scope was almost the level of  the renal pelvis and the stone in the ureter in question was not visualized. It was felt to likely represent retrograde positioning of the stone.  As the semirigid scope was exchanged for a ureteral access sheath to the level of proximal ureter using  continuous fluoroscopic guidance and flexible digital ureteroscopy was performed of the proximal ureter and systematic inspection of left kidney. As anticipated the previous ureteral stone was in fact retrograde positioned and this was in a lower pole  calix alongside the other known lower pole stone.  The patient was then repositioned with Escape basket to an upper pole calix to allow for less acute angulation.  They both appeared too large for simple basketing  alone and then holmium laser energy  applied stone using settings of 0.2 joules and 40 Hz and approximately  50% of stone was dusted 80% fragmented and the fragments then being amenable to basketing with the Escape basket.  Following this, complete resolution of all accessible stone  fragments larger than one-third mm. Access sheath was removed under continuous vision, no significant mucosal abnormalities found.  Given the hydronephrosis and access sheath usage, it was felt that brief interval stenting with tethered stent would be  most prudent.  As such, a new 5 x 24 Polaris type stent was carefully placed using fluoroscopic guidance. Good proximal and distal planes were noted.  Tether was left in place, fashioned to the dorsum of penis.  The procedure was terminated.  The patient  tolerated the procedure well, no immediate periprocedural complications.  The patient was taken to postanesthesia care in stable condition.  Plan for discharge home.   PUS D: 01/01/2023 11:49:22 am T: 01/01/2023 1:41:00 pm  JOB: [id number]/ [id number]

## 2023-01-01 NOTE — Anesthesia Procedure Notes (Signed)
Procedure Name: LMA Insertion Date/Time: 01/01/2023 10:57 AM  Performed by: Bishop Limbo, CRNAPre-anesthesia Checklist: Patient identified, Emergency Drugs available, Suction available and Patient being monitored Patient Re-evaluated:Patient Re-evaluated prior to induction Oxygen Delivery Method: Circle system utilized Preoxygenation: Pre-oxygenation with 100% oxygen Induction Type: IV induction Ventilation: Mask ventilation without difficulty LMA: LMA inserted LMA Size: 4.0 Number of attempts: 1 Airway Equipment and Method: Bite block Placement Confirmation: positive ETCO2 and breath sounds checked- equal and bilateral Tube secured with: Tape Dental Injury: Teeth and Oropharynx as per pre-operative assessment

## 2023-01-02 ENCOUNTER — Encounter (HOSPITAL_COMMUNITY): Payer: Self-pay | Admitting: Urology
